# Patient Record
Sex: Female | Born: 1990 | Race: White | Hispanic: No | Marital: Married | Smoking: Never smoker
Health system: Southern US, Community
[De-identification: ages and names within clinical notes are randomized; demographics above are authoritative.]

## PROBLEM LIST (undated history)

## (undated) DIAGNOSIS — H50811 Duane's syndrome, right eye: Secondary | ICD-10-CM

## (undated) DIAGNOSIS — K5090 Crohn's disease, unspecified, without complications: Secondary | ICD-10-CM

## (undated) HISTORY — DX: Duane's syndrome, right eye: H50.811

## (undated) HISTORY — DX: Crohn's disease, unspecified, without complications: K50.90

## (undated) HISTORY — PX: EYE SURGERY: SHX253

## (undated) HISTORY — PX: APPENDECTOMY: SHX54

---

## 2016-03-29 ENCOUNTER — Encounter: Payer: Self-pay | Admitting: Family Medicine

## 2016-03-29 ENCOUNTER — Ambulatory Visit (INDEPENDENT_AMBULATORY_CARE_PROVIDER_SITE_OTHER): Payer: BC Managed Care – PPO | Admitting: Family Medicine

## 2016-03-29 VITALS — BP 120/82 | HR 87 | Temp 98.5°F | Resp 12 | Ht 64.0 in | Wt 145.0 lb

## 2016-03-29 DIAGNOSIS — Z30011 Encounter for initial prescription of contraceptive pills: Secondary | ICD-10-CM

## 2016-03-29 DIAGNOSIS — Z309 Encounter for contraceptive management, unspecified: Secondary | ICD-10-CM | POA: Insufficient documentation

## 2016-03-29 LAB — POCT URINE PREGNANCY: PREG TEST UR: NEGATIVE

## 2016-03-29 MED ORDER — LEVONORGESTREL-ETHINYL ESTRAD 0.1-20 MG-MCG PO TABS: 1.0000 | ORAL_TABLET | Freq: Every day | ORAL | Status: DC

## 2016-03-29 NOTE — Patient Instructions (Addendum)
A few things to remember from today's visit:   Encounter for initial prescription of contraceptive pills - Plan: levonorgestrel-ethinyl estradiol (AVIANE,ALESSE,LESSINA) 0.1-20 MG-MCG tablet, POCT urine pregnancy   Oral contraceptive pills do not protect against sexual transmitted disease. Please take it daily around same time. Some side effects: Headache, nausea, mood swings,vaginal spotting,benign liver tumors, and blood clots (mainly among smokers).  If a new problem present, please set up appointment sooner than planned today. Pap smear needs to be arranged.  Healthy diet and regular physical activity for prevention of chronic illness.

## 2016-03-29 NOTE — Progress Notes (Signed)
Pre visit review using our clinic review tool, if applicable. No additional management support is needed unless otherwise documented below in the visit note. 

## 2016-03-29 NOTE — Progress Notes (Signed)
HPI:   [name] is a 25 y.o. female, who is here today to establish care with me.  Former PCP: Moved to this area recently. Last preventive routine visit: a few years ago. She is otherwise healthy, denies any chronic major medical problem.   Concerns today: She would like to initiate home OCPs, last taken in August 2017, she had to stop because health insurance issues. She had taken OCPs for 3-4 years, initially was started to control dysmenorrhea and irregular menstrual but later for contraception. In general medication was well tolerated, she denies any side effect. No history of tobacco use.  G0. Cycles 5 weeks/7 d. Mild dysmenorrhea but not as bad as it used to be before starting OCP's. She is currently using condoms for contraception. She denies any history of depression or anxiety.   She lives with husband.  She has not had a pap smear.  In general she eats healthy and exercises regularly.     Review of Systems  Constitutional: Negative for fever, activity change, appetite change, fatigue and unexpected weight change.  HENT: Negative for facial swelling and trouble swallowing.   Respiratory: Negative for cough, shortness of breath and wheezing.   Cardiovascular: Negative for chest pain, palpitations and leg swelling.  Gastrointestinal: Negative for nausea, vomiting and abdominal pain.  Genitourinary: Negative for dysuria, vaginal bleeding, vaginal discharge, vaginal pain, menstrual problem (LMP 02/29/16.) and pelvic pain.  Musculoskeletal: Negative for myalgias, arthralgias and gait problem.  Skin: Negative for color change and rash.  Neurological: Negative for syncope, weakness and headaches.  Psychiatric/Behavioral: Negative for behavioral problems and confusion. The patient is not nervous/anxious.       No current outpatient prescriptions on file prior to visit.   No current facility-administered medications on file prior to visit.      History reviewed. No pertinent past medical history. Not on File  History reviewed. No pertinent family history.  Social History   Social History  . Marital Status: Married    Spouse Name: N/A  . Number of Children: N/A  . Years of Education: N/A   Social History Main Topics  . Smoking status: Never Smoker   . Smokeless tobacco: None  . Alcohol Use: None  . Drug Use: None  . Sexual Activity: Not Asked   Other Topics Concern  . None   Social History Narrative  . None    Filed Vitals:   03/29/16 1446  BP: 120/82  Pulse: 87  Temp: 98.5 F (36.9 C)  Resp: 12    Body mass index is 24.88 kg/(m^2).  SpO2 Readings from Last 3 Encounters:  03/29/16 98%     Physical Exam  Nursing note and vitals reviewed. Constitutional: She is oriented to person, place, and time. She appears well-developed and well-nourished. No distress.  HENT:  Head: Atraumatic.  Eyes: Conjunctivae and EOM are normal. Pupils are equal, round, and reactive to light.  Right upper eye lid mild ptosis.  Neck: No thyromegaly present.  Cardiovascular: Normal rate and regular rhythm.   No murmur heard. Pulses:      Dorsalis pedis pulses are 2+ on the right side, and 2+ on the left side.  Respiratory: Effort normal and breath sounds normal. No respiratory distress.  GI: Soft. She exhibits no mass. There is no hepatomegaly. There is no tenderness.  Musculoskeletal: She exhibits no edema.  Lymphadenopathy:    She has no cervical adenopathy.  Neurological: She is alert and oriented to person, place,  and time. She has normal strength. Coordination and gait normal.  Skin: Skin is warm. No erythema.  Psychiatric: She has a normal mood and affect. Her speech is normal.  Well groomed, good eye contact.      ASSESSMENT AND PLAN:     Carla Stephens was seen today for new patient (initial visit).  Diagnoses and all orders for this visit:  Encounter for initial prescription of contraceptive  pills -     levonorgestrel-ethinyl estradiol (AVIANE,ALESSE,LESSINA) 0.1-20 MG-MCG tablet; Take 1 tablet by mouth daily. -     POCT urine pregnancy  Negative urine pregnancy test today.  OCP was sent to the pharmacy. Some side effects of OCPs discussed. Strongly recommended to schedule appointment for a Pap smear. Follow-up in a year, before if needed.        Carla G. Martinique, MD  [facility]. [place] office.

## 2016-11-30 ENCOUNTER — Other Ambulatory Visit: Payer: Self-pay | Admitting: Family Medicine

## 2016-11-30 DIAGNOSIS — Z30011 Encounter for initial prescription of contraceptive pills: Secondary | ICD-10-CM

## 2017-03-02 NOTE — Progress Notes (Addendum)
HPI:   [name] is a 26 y.o. female, who is here today for her routine physical.  She was seen on 03/29/16.  Regular exercise 3 or more time per week: Not consistently. Following a healthy diet: Trying to do better. She lives with her husband.  Chronic medical problems: Otherwise healthy.  Pap smear: Never. Hx of STD's: Denies. G:0 She is sexually active on OCP's, resumed 03/2016. It has also helped with dysmenorrhea.   She has tolerated well,no side effects reported.  She denies depressed mood   Review of Systems  Constitutional: Negative for appetite change, fatigue, fever and unexpected weight change.  HENT: Negative for dental problem, hearing loss, nosebleeds, trouble swallowing and voice change.   Eyes: Negative for photophobia and visual disturbance.  Respiratory: Negative for cough, shortness of breath and wheezing.   Cardiovascular: Negative for chest pain and leg swelling.  Gastrointestinal: Negative for abdominal pain, blood in stool, nausea and vomiting.       No changes in bowel habits.  Endocrine: Negative for cold intolerance, heat intolerance, polydipsia, polyphagia and polyuria.  Genitourinary: Negative for decreased urine volume, dysuria, hematuria, menstrual problem, vaginal bleeding and vaginal discharge.       No breast tenderness or nipple discharge.  Musculoskeletal: Negative for gait problem and myalgias.  Skin: Negative for rash.  Neurological: Negative for syncope, weakness, numbness and headaches.  Hematological: Negative for adenopathy. Does not bruise/bleed easily.  Psychiatric/Behavioral: Negative for confusion and sleep disturbance. The patient is not nervous/anxious.   All other systems reviewed and are negative.    No current outpatient prescriptions on file prior to visit.   No current facility-administered medications on file prior to visit.      Past Medical History:  Diagnosis Date  . 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 Hypertension Father   . Hypertension Maternal Grandfather   . Cancer Neg Hx   . Diabetes Neg Hx   . Hyperlipidemia Neg Hx     Social History   Social History  . Marital status: Married    Spouse name: N/A  . Number of children: N/A  . Years of education: N/A   Social History Main Topics  . Smoking status: Never Smoker  . Smokeless tobacco: Never Used  . Alcohol use None  . Drug use: Unknown  . Sexual activity: Not Asked   Other Topics Concern  . None   Social History Narrative  . None     Vitals:   03/03/17 1125  BP: 120/80  Pulse: 96  Resp: 12   Body mass index is 26.35 kg/m.  O2 sat ar RA 98%  Wt Readings from Last 3 Encounters:  03/03/17 153 lb 8 oz (69.6 kg)  03/29/16 145 lb (65.8 kg)    Physical Exam  Nursing note and vitals reviewed. Constitutional: She is oriented to person, place, and time. She appears well-developed and well-nourished. No distress.  HENT:  Head: Atraumatic.  Right Ear: Hearing, tympanic membrane, external ear and ear canal normal.  Left Ear: Hearing, tympanic membrane, external ear and ear canal normal.  Mouth/Throat: Uvula is midline, oropharynx is clear and moist and mucous membranes are normal.  Eyes: Conjunctivae are normal. Pupils are equal, round, and reactive to light. Right eye exhibits abnormal extraocular motion.  Mild limitation abduction right eye, exophoria.  Neck: No  tracheal deviation present. No thyromegaly present.  Cardiovascular: Normal rate and regular rhythm.   No murmur heard. Pulses:      Dorsalis pedis pulses are 2+ on the right side, and 2+ on the left side.  Respiratory: Effort normal and breath sounds normal. No respiratory distress.  GI: Soft. She exhibits no mass. There is no hepatomegaly. There is no tenderness.  Genitourinary:  Genitourinary Comments: Refused pap smear.    Musculoskeletal: She exhibits no edema.  No major deformity or signs of synovitis appreciated.  Lymphadenopathy:    She has no cervical adenopathy.       Right: No supraclavicular adenopathy present.       Left: No supraclavicular adenopathy present.  Neurological: She is alert and oriented to person, place, and time. She has normal strength. No cranial nerve deficit or sensory deficit. Coordination and gait normal.  Reflex Scores:      Bicep reflexes are 2+ on the right side and 2+ on the left side.      Patellar reflexes are 2+ on the right side and 2+ on the left side. Skin: Skin is warm. No rash noted. No erythema.  Psychiatric: She has a normal mood and affect. Cognition and memory are normal.  Well groomed, good eye contact.     ASSESSMENT AND PLAN:   Carla Stephens was seen today for annual exam.  Diagnoses and all orders for this visit:  Routine general medical examination at a health care facility  We discussed the importance of regular physical activity and healthy diet for prevention of chronic illness. Preventive guidelines reviewed. Vaccination up to date. She refused gyn examination/pap smear today. Strongly recommended setting up appt, she ensures me she will do so mid or later summer/2018. Next CPE in 1-3 years.   Encounter for surveillance of contraceptive pills  Tolerating OCP's well. Side effects reviewed. F/U in a year.  -     levonorgestrel-ethinyl estradiol (AVIANE) 0.1-20 MG-MCG tablet; Take 1 tablet by mouth daily.   Return in 1 year (on 03/03/2018) for pap smear appt needed in the next few months..     Betty G. Martinique, MD  [facility]. [place] office.

## 2017-03-03 ENCOUNTER — Encounter: Payer: Self-pay | Admitting: Family Medicine

## 2017-03-03 ENCOUNTER — Ambulatory Visit (INDEPENDENT_AMBULATORY_CARE_PROVIDER_SITE_OTHER): Payer: BC Managed Care – PPO | Admitting: Family Medicine

## 2017-03-03 VITALS — BP 120/80 | HR 96 | Resp 12 | Ht 64.0 in | Wt 153.5 lb

## 2017-03-03 DIAGNOSIS — Z0001 Encounter for general adult medical examination with abnormal findings: Secondary | ICD-10-CM

## 2017-03-03 DIAGNOSIS — Z3041 Encounter for surveillance of contraceptive pills: Secondary | ICD-10-CM | POA: Diagnosis not present

## 2017-03-03 DIAGNOSIS — H5052 Exophoria: Secondary | ICD-10-CM

## 2017-03-03 DIAGNOSIS — Z0000 Encounter for general adult medical examination without abnormal findings: Secondary | ICD-10-CM

## 2017-03-03 MED ORDER — LEVONORGESTREL-ETHINYL ESTRAD 0.1-20 MG-MCG PO TABS: 1.0000 | ORAL_TABLET | Freq: Every day | ORAL | 2 refills | Status: DC

## 2017-03-03 NOTE — Patient Instructions (Signed)
A few things to remember from today's visit:   Routine general medical examination at a health care facility    At least 150 minutes of moderate exercise per week, daily brisk walking for 15-30 min is a good exercise option. Healthy diet low in saturated (animal) fats and sweets and consisting of fresh fruits and vegetables, lean meats such as fish and white chicken and whole grains.   - Vaccines:  Tdap vaccine every 10 years.  Shingles vaccine recommended at age 26, could be given after 26 years of age but not sure about insurance coverage.  Pneumonia vaccines:  Prevnar 13 at 65 and Pneumovax at 58.  Screening recommendations for low/normal risk women:  Screening for diabetes at age 44-45 and every 3 years.  Cervical cancer prevention:  Must be done. -HPV vaccination between 25-52 years old. -Pap smear starts at 26 years of age and continues periodically until 26 years old in low risk women. Pap smear every 3 years between 45 and 25 years old. Pap smear every 3 years between women 24 and older if pap smear negative and HPV screening negative.   -Breast cancer: Mammogram: There is disagreement between experts about when to start screening in low risk asymptomatic female but recent recommendations are to start screening at 54 and not later than 26 years old , every 1-2 years and after 27 yo q 2 years. Screening is recommended until 26 years old but some women can continue screening depending of healthy issues.   Colon cancer screening: starts at 26 years old until 25 years old.  Cholesterol disorder screening at age 20 and every 3 years.  Also recommended:  1. Dental visit- Brush and floss your teeth twice daily; visit your dentist twice a year. 2. Eye doctor- Get an eye exam at least every 2 years. 3. Helmet use- Always wear a helmet when riding a bicycle, motorcycle, rollerblading or skateboarding. 4. Safe sex- If you may be exposed to sexually transmitted infections, use a  condom. 5. Seat belts- Seat belts can save your live; always wear one. 6. Smoke/Carbon Monoxide detectors- These detectors need to be installed on the appropriate level of your home. Replace batteries at least once a year. 7. Skin cancer- When out in the sun please cover up and use sunscreen 15 SPF or higher. 8. Violence- If anyone is threatening or hurting you, please tell your healthcare provider.  9. Drink alcohol in moderation- Limit alcohol intake to one drink or less per day. Never drink and drive.  Please be sure medication list is accurate. If a new problem present, please set up appointment sooner than planned today.

## 2017-03-13 ENCOUNTER — Encounter: Payer: Self-pay | Admitting: Family Medicine

## 2017-03-13 ENCOUNTER — Ambulatory Visit (INDEPENDENT_AMBULATORY_CARE_PROVIDER_SITE_OTHER): Payer: BC Managed Care – PPO | Admitting: Family Medicine

## 2017-03-13 ENCOUNTER — Other Ambulatory Visit (HOSPITAL_COMMUNITY)
Admission: RE | Admit: 2017-03-13 | Discharge: 2017-03-13 | Disposition: A | Discharge status: Home / Self Care | Payer: BC Managed Care – PPO | Source: Ambulatory Visit | Attending: Family Medicine | Admitting: Family Medicine

## 2017-03-13 VITALS — BP 120/70 | HR 62 | Resp 12 | Ht 64.0 in | Wt 152.5 lb

## 2017-03-13 DIAGNOSIS — Z01419 Encounter for gynecological examination (general) (routine) without abnormal findings: Secondary | ICD-10-CM | POA: Insufficient documentation

## 2017-03-13 DIAGNOSIS — Z124 Encounter for screening for malignant neoplasm of cervix: Secondary | ICD-10-CM

## 2017-03-13 NOTE — Progress Notes (Signed)
      HPI:   Carla Stephens is a 26 y.o. female, who is here today for her pap smear. I saw her recently for her routine CPE, she did not want her pap smear done at that time.  Pap smear :Has not had one before. Hx of STD's: Denies  LMP 5-7 days ago.   Review of Systems  Genitourinary: Negative for dyspareunia, dysuria, menstrual problem, vaginal bleeding and vaginal discharge.       Denies breast tenderness,masses,or nipple discharge.      Current Outpatient Prescriptions on File Prior to Visit  Medication Sig Dispense Refill  . levonorgestrel-ethinyl estradiol (AVIANE) 0.1-20 MG-MCG tablet Take 1 tablet by mouth daily. 84 tablet 2   No current facility-administered medications on file prior to visit.      Past Medical History:  Diagnosis Date  . 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 Hypertension Father   . Hypertension Maternal Grandfather   . Cancer Neg Hx   . Diabetes Neg Hx   . Hyperlipidemia Neg Hx     Social History   Social History  . Marital status: Married    Spouse name: N/A  . Number of children: N/A  . Years of education: N/A   Social History Main Topics  . Smoking status: Never Smoker  . Smokeless tobacco: Never Used  . Alcohol use None  . Drug use: Unknown  . Sexual activity: Not Asked   Other Topics Concern  . None   Social History Narrative  . None     Vitals:   03/13/17 0954  BP: 120/70  Pulse: 62  Resp: 12   Body mass index is 26.18 kg/m.   Wt Readings from Last 3 Encounters:  03/03/17 153 lb 8 oz (69.6 kg)  03/29/16 145 lb (65.8 kg)     Physical Exam  Nursing note reviewed. Constitutional: She is oriented to person, place, and time. She appears well-developed. No distress.  Eyes: Conjunctivae are normal.  GI: Soft. She exhibits no mass. There is no tenderness.  Genitourinary: Uterus normal. There is no  tenderness or lesion on the right labia. There is no tenderness or lesion on the left labia. Cervix exhibits friability. Cervix exhibits no motion tenderness and no discharge. Right adnexum displays no mass and no tenderness. Left adnexum displays no mass and no tenderness. There is bleeding in the vagina. No erythema in the vagina. No vaginal discharge found.  Genitourinary Comments: Refused breast exam. Cervix:Transformation zone visualized, Pap smear colleted.  Musculoskeletal: She exhibits no edema.  Neurological: She is alert and oriented to person, place, and time. Gait normal.  Psychiatric: She has a normal mood and affect.  Well groomed, good eye contact.     ASSESSMENT AND PLAN:   [name] was seen today for gynecologic exam.  Diagnoses and all orders for this visit:  Cervical cancer screening -     PAP [[place]]  Screening for malignant neoplasm of cervix -     PAP [[facility] [facility]]   Pap smear collected. Current recommendations for gyn preventive care. If negative next pap smear can be done in 3 years.    Return in 1 year (on 03/13/2018).    Jayce Boyko G. Martinique, MD  [facility]. [place] office.

## 2017-03-14 ENCOUNTER — Encounter: Payer: Self-pay | Admitting: Family Medicine

## 2017-03-14 LAB — CYTOLOGY - PAP: Diagnosis: NEGATIVE

## 2017-06-08 ENCOUNTER — Encounter: Payer: Self-pay | Admitting: Family Medicine

## 2017-07-10 ENCOUNTER — Encounter: Payer: Self-pay | Admitting: Family Medicine

## 2017-07-10 ENCOUNTER — Ambulatory Visit (INDEPENDENT_AMBULATORY_CARE_PROVIDER_SITE_OTHER): Payer: BC Managed Care – PPO | Admitting: Family Medicine

## 2017-07-10 VITALS — BP 116/66 | HR 82 | Temp 98.5°F | Resp 12 | Ht 64.0 in | Wt 155.2 lb

## 2017-07-10 DIAGNOSIS — R197 Diarrhea, unspecified: Secondary | ICD-10-CM

## 2017-07-10 DIAGNOSIS — K625 Hemorrhage of anus and rectum: Secondary | ICD-10-CM | POA: Diagnosis not present

## 2017-07-10 NOTE — Patient Instructions (Signed)
A few things to remember from today's visit:   Rectal bleeding - Plan: CBC, C-reactive protein, Sedimentation rate, Ambulatory referral to Gastroenterology  Diarrhea, unspecified type - Plan: Ova and Parasite Examination   Please be sure medication list is accurate. If a new problem present, please set up appointment sooner than planned today.

## 2017-07-10 NOTE — Progress Notes (Signed)
ACUTE VISIT   HPI:  Chief Complaint  Patient presents with  . Blood In Stools    [name] is a 26 y.o. female, who is here today complaining of  2 weeks of red bright rectal bleeding associated with defecation. States that she sees blood on tissue and in toilet 99% of the time for the past 2 weeks.  She had similar problem in 03/2017 but resolved after a week.  She denies associated fever, chills, myalgias, dyspnea, abdominal pain, nausea, vomiting, or changes in bowel habits. Later during visit and after examination she remembers she had a couple of loose stools since problem started.  She denies dyschezia or history of constipation/straining. Denies recent travel. No recent changes in her diet. No abx treatments in the past few months. She has not identified exacerbating or factors.  She denies FHx of IBD or colon cancer.   No other abnormal bleeding or heavier menstrual flow.  Review of Systems  Constitutional: Negative for activity change, appetite change, fatigue, fever and unexpected weight change.  HENT: Negative for mouth sores, nosebleeds, sore throat and trouble swallowing.   Respiratory: Negative for cough, shortness of breath and wheezing.   Gastrointestinal: Positive for anal bleeding and diarrhea. Negative for abdominal distention, abdominal pain, constipation, nausea and vomiting.  Endocrine: Negative for cold intolerance and heat intolerance.  Genitourinary: Negative for dysuria, frequency, hematuria and menstrual problem.  Musculoskeletal: Negative for arthralgias, gait problem and neck pain.  Skin: Negative for pallor and rash.  Allergic/Immunologic: Negative for food allergies.  Neurological: Negative for syncope, weakness and headaches.  Hematological: Negative for adenopathy. Does not bruise/bleed easily.      Current Outpatient Prescriptions on File Prior to Visit  Medication Sig Dispense Refill  . levonorgestrel-ethinyl  estradiol (AVIANE) 0.1-20 MG-MCG tablet Take 1 tablet by mouth daily. 84 tablet 2   No current facility-administered medications on file prior to visit.      Past Medical History:  Diagnosis Date  . 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 Marital status: Married    Spouse name: N/A  . Number of children: N/A  . Years of education: N/A   Social History Main Topics  . Smoking status: Never Smoker  . Smokeless tobacco: Never Used  . Alcohol use No  . Drug use: No  . Sexual activity: Yes   Other Topics Concern  . None   Social History Narrative  . None    Vitals:   07/10/17 1625  BP: 116/66  Pulse: 82  Resp: 12  Temp: 98.5 F (36.9 C)  SpO2: 98%   Body mass index is 26.65 kg/m.   Physical Exam  Nursing note and vitals reviewed. Constitutional: She is oriented to person, place, and time. She appears well-developed and well-nourished. She does not appear ill. No distress.  HENT:  Head: Atraumatic.  Mouth/Throat: Oropharynx is clear and moist and mucous membranes are normal.  Eyes: Conjunctivae are normal. No scleral icterus.  Neck: No tracheal deviation present. No thyromegaly present.  Cardiovascular: Normal rate and regular rhythm.   No murmur heard. Respiratory: Effort normal and breath sounds normal. No respiratory distress.  GI: Soft. Bowel sounds are normal. She exhibits no distension and no mass. There is no hepatomegaly. There is no tenderness. There is no CVA tenderness.  Genitourinary: Rectal exam shows guaiac positive stool. Rectal exam shows  no fissure, no mass, no tenderness and anal tone normal.  Genitourinary Comments: External hemorrhoids palpable in anal canal, not noted on inspection. She had some discomfort with rectal exam, denies pain. Small amount of loose stool in glove, light brownish,no blood.  Musculoskeletal: She exhibits no edema.  Lymphadenopathy:    She  has no cervical adenopathy.  Neurological: She is alert and oriented to person, place, and time. She has normal strength. Gait normal.  Skin: Skin is warm. No rash noted. No erythema.  Psychiatric: She has a normal mood and affect.  Well groomed, good eye contact.      ASSESSMENT AND PLAN:   [name] was seen today for blood in stools.  Diagnoses and all orders for this visit:  Rectal bleeding  We discussed possible etiologies, including internal and external hemorrhoids,inflammatory,and infectious. Because no associated abdominal pain, I am holding on stool Cx.  Because it does not seem to be related to constipation and it seems to be recurrent, I think it is appropriate to arrange for GI evaluation.  For the recommendations would be given according to lab results. Instructed about warning signs.  -     CBC -     C-reactive protein -     Sedimentation rate -     Ambulatory referral to Gastroenterology  Diarrhea, unspecified type  Reporting a couple episodes. Adequate hydration. Further recommendations would be given according to stool examination.  -     Ova and Parasite Examination   -[name] was advised to seek immediate medical attention if sudden worsening symptoms.     Carla Besse G. Martinique, MD  [facility]. [place] office.

## 2017-07-11 ENCOUNTER — Encounter: Payer: Self-pay | Admitting: Gastroenterology

## 2017-07-11 LAB — CBC
HCT: 43.8 % (ref 36.0–46.0)
Hemoglobin: 14.5 g/dL (ref 12.0–15.0)
MCHC: 33.1 g/dL (ref 30.0–36.0)
MCV: 84.2 fl (ref 78.0–100.0)
Platelets: 342 10*3/uL (ref 150.0–400.0)
RBC: 5.21 Mil/uL — ABNORMAL HIGH (ref 3.87–5.11)
RDW: 14.1 % (ref 11.5–15.5)
WBC: 9.1 10*3/uL (ref 4.0–10.5)

## 2017-07-11 LAB — C-REACTIVE PROTEIN: CRP: 1.6 mg/dL (ref 0.5–20.0)

## 2017-07-11 LAB — SEDIMENTATION RATE: SED RATE: 51 mm/h — AB (ref 0–20)

## 2017-07-14 ENCOUNTER — Encounter: Payer: Self-pay | Admitting: Family Medicine

## 2017-07-20 ENCOUNTER — Encounter: Payer: Self-pay | Admitting: Family Medicine

## 2017-07-20 LAB — OVA AND PARASITE EXAMINATION
CONCENTRATE RESULT:: NONE SEEN
SPECIMEN QUALITY:: ADEQUATE
TRICHROME RESULT:: NONE SEEN
VKL: 81215422

## 2017-07-20 LAB — TIQ-NTM

## 2017-08-22 ENCOUNTER — Encounter: Payer: Self-pay | Admitting: Gastroenterology

## 2017-08-28 ENCOUNTER — Ambulatory Visit: Payer: BC Managed Care – PPO | Admitting: Gastroenterology

## 2017-09-06 ENCOUNTER — Ambulatory Visit: Payer: BC Managed Care – PPO | Admitting: Gastroenterology

## 2017-09-13 ENCOUNTER — Ambulatory Visit: Payer: BC Managed Care – PPO | Admitting: Gastroenterology

## 2017-09-22 ENCOUNTER — Ambulatory Visit: Payer: BC Managed Care – PPO | Admitting: Physician Assistant

## 2017-09-22 ENCOUNTER — Encounter: Payer: Self-pay | Admitting: Physician Assistant

## 2017-09-22 VITALS — BP 122/80 | HR 88 | Ht 64.0 in | Wt 155.2 lb

## 2017-09-22 DIAGNOSIS — K602 Anal fissure, unspecified: Secondary | ICD-10-CM | POA: Diagnosis not present

## 2017-09-22 DIAGNOSIS — K921 Melena: Secondary | ICD-10-CM

## 2017-09-22 MED ORDER — AMBULATORY NON FORMULARY MEDICATION: 0.1250 mg | Freq: Two times a day (BID) | 1 refills | Status: DC

## 2017-09-22 NOTE — Patient Instructions (Signed)
We have sent a prescription for nitroglycerin 0.125% gel to [facility]. You should apply a pea size amount to your rectum twice times daily x 6-8 weeks.  [facility]'s information is below: Address: [address], [place], [place] [address]  [phone]   How to Take a Sitz Bath A sitz bath is a warm water bath that is taken while you are sitting down. The water should only come up to your hips and should cover your buttocks. Your health care provider may recommend a sitz bath to help you:  Clean the lower part of your body, including your genital area.  With itching.  With pain.  With sore muscles or muscles that tighten or spasm.  How to take a sitz bath Take 3-4 sitz baths per day or as told by your health care provider. 1. Partially fill a bathtub with warm water. You will only need the water to be deep enough to cover your hips and buttocks when you are sitting in it. 2. If your health care provider told you to put medicine in the water, follow the directions exactly. 3. Sit in the water and open the tub drain a little. 4. Turn on the warm water again to keep the tub at the correct level. Keep the water running constantly. 5. Soak in the water for 15-20 minutes or as told by your health care provider. 6. After the sitz bath, pat the affected area dry first. Do not rub it. 7. Be careful when you stand up after the sitz bath because you may feel dizzy.  Contact a health care provider if:  Your symptoms get worse. Do not continue with sitz baths if your symptoms get worse.  You have new symptoms. Do not continue with sitz baths until you talk with your health care provider. This information is not intended to replace advice given to you by your health care provider. Make sure you discuss any questions you have with your health care provider. Document Released: 05/28/2004 Document Revised: 02/03/2016 Document Reviewed: 09/03/2014 Elsevier Interactive  Patient Education  [employer].

## 2017-09-22 NOTE — Progress Notes (Signed)
 Chief Complaint: Rectal bleeding  HPI:    Carla Stephens is a 26-year-old Caucasian female with a past medical history as listed below, who was referred to me by Jordan, Betty G, MD for a complaint of rectal bleeding.      Review of chart patient saw her PCP 07/10/17 and did have guaiac positive stool with external hemorrhoids palpable in the anal canal, not noted on inspection she had some discomfort with rectal exam and a small amount of loose stool on the glove which was light brownish with no blood.  Patient had a CRP, CBC and ESR as well as ova and parasite stool.  ESR was elevated at 51, CRP was normal, CBC normal.  O&P was negative.    Today, the patient describes that the first episodes of rectal bleeding began in July and occurred for about 2 weeks straight, this then went away and came back in October and patient tells me that now it is about 80-90% of the time when she has a bowel movement.  The patient describes this as bright red blood which she sees on the toilet paper and in her stool as well as in the toilet bowl at times.  This can vary in amount.  Sometimes she sees just a drop and other times it is more.  Patient describes that there is very little rectal tenderness, though "when I have a larger than normal stools are harder than normal stool it does hurt".  Patient has not been trying to do anything for this other than starting a fiber tablet which has seemed to help with her days of occasional loose stools which occur "here and there", the patient blames this on what she eats occasionally.    Patient denies fever, chills, weight loss, anorexia, nausea, vomiting, change in bowel habits or family history of IBD.  Past Medical History:  Diagnosis Date  . 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 APPENDECTOMY    . EYE SURGERY Bilateral     Current Outpatient Medications  Medication Sig Dispense Refill  .  levonorgestrel-ethinyl estradiol (AVIANE) 0.1-20 MG-MCG tablet Take 1 tablet by mouth daily. 84 tablet 2   No current facility-administered medications for this visit.     Allergies as of 09/22/2017  . (No Known Allergies)    Family History  Problem Relation Age of Onset  . Hypertension Father   . Hypertension Maternal Grandfather   . Cancer Neg Hx   . Diabetes Neg Hx   . Hyperlipidemia Neg Hx     Social History   Socioeconomic History  . Marital status: Married    Spouse name: Not on file  . Number of children: Not on file  . Years of education: Not on file  . Highest education level: Not on file  Social Needs  . Financial resource strain: Not on file  . Food insecurity - worry: Not on file  . Food insecurity - inability: Not on file  . Transportation needs - medical: Not on file  . Transportation needs - non-medical: Not on file  Occupational History  . Not on file  Tobacco Use  . Smoking status: Never Smoker  . Smokeless tobacco: Never Used  Substance and Sexual Activity  . Alcohol use: No    Alcohol/week: 0.0 oz  . Drug use: No  . Sexual activity: Yes  Other Topics Concern  . Not on   file  Social History Narrative  . Not on file    Review of Systems:    Constitutional: No weight loss, fever or chills Skin: No rash Cardiovascular: No chest pain Respiratory: No SOB  Gastrointestinal: See HPI and otherwise negative Genitourinary: No dysuria  Neurological: No headache, dizziness or syncope Musculoskeletal: No new muscle or joint pain Hematologic: No brusising Psychiatric: No history of depression or anxiety   Physical Exam:  Vital signs: BP 122/80   Pulse 88   Ht 5' 4" (1.626 m)   Wt 155 lb 3.2 oz (70.4 kg)   BMI 26.64 kg/m    Constitutional:   Pleasant Caucasian female appears to be in NAD, Well developed, Well nourished, alert and cooperative Head:  Normocephalic and atraumatic. Eyes:   PEERL, EOMI. No icterus. Conjunctiva pink. Ears:  Normal  auditory acuity. Neck:  Supple Throat: Oral cavity and pharynx without inflammation, swelling or lesion.  Respiratory: Respirations even and unlabored. Lungs clear to auscultation bilaterally.   No wheezes, crackles, or rhonchi.  Cardiovascular: Normal S1, S2. No MRG. Regular rate and rhythm. No peripheral edema, cyanosis or pallor.  Gastrointestinal:  Soft, nondistended, nontender. No rebound or guarding. Normal bowel sounds. No appreciable masses or hepatomegaly. Rectal: External exam: Visible posterior anal fissure approximately 2 cm in length and fairly deep with no signs of active bleeding, very tender to palpation; internal exam deferred due to findings of external exam Msk:  Symmetrical without gross deformities. Without edema, no deformity or joint abnormality.  Neurologic:  Alert and  oriented x4;  grossly normal neurologically.  Skin:   Dry and intact without significant lesions or rashes. Psychiatric: Demonstrates good judgement and reason without abnormal affect or behaviors.  MOST RECENT LABS AND IMAGING: CBC    Component Value Date/Time   WBC 9.1 07/10/2017 1714   RBC 5.21 (H) 07/10/2017 1714   HGB 14.5 07/10/2017 1714   HCT 43.8 07/10/2017 1714   PLT 342.0 07/10/2017 1714   MCV 84.2 07/10/2017 1714   MCHC 33.1 07/10/2017 1714   RDW 14.1 07/10/2017 1714   Assessment: 1.  Anal fissure: Seen at time of exam today with bleeding off and on over the past 3 months, very tender on palpation today, discussed that this is very likely the cause of patient's rectal bleeding 2. Hematochezia: With above.  Plan: 1.  Prescribed Nitroglycerin ointment to be applied twice daily times 6-8 weeks 2.  Recommend the patient do sitz baths for 15-20 minutes at a time 2-3 times a day 3.  Encouraged the patient to continue her fiber supplement as this is seeming to decrease the amount of days that she has loose stools. 4.  Discussed that the patient can buy over-the-counter recta- care cream  with lidocaine if she has any pain at site of fissure 5.  Patient to follow in clinic with me in 6-8 weeks.  She was assigned to Dr. Stark today.   , PA-C [facility] 09/22/2017, 2:11 PM  Cc: Jordan, Betty G, MD 

## 2017-09-22 NOTE — Progress Notes (Signed)
Reviewed and agree with initial management plan.  Aspynn Clover T. Tiki Tucciarone, MD FACG 

## 2018-01-11 ENCOUNTER — Other Ambulatory Visit: Payer: Self-pay | Admitting: Family Medicine

## 2018-01-11 DIAGNOSIS — Z3041 Encounter for surveillance of contraceptive pills: Secondary | ICD-10-CM

## 2018-05-06 ENCOUNTER — Other Ambulatory Visit: Payer: Self-pay | Admitting: Family Medicine

## 2018-05-06 DIAGNOSIS — Z3041 Encounter for surveillance of contraceptive pills: Secondary | ICD-10-CM

## 2018-05-18 ENCOUNTER — Other Ambulatory Visit: Payer: Self-pay | Admitting: Family Medicine

## 2018-05-18 DIAGNOSIS — Z3041 Encounter for surveillance of contraceptive pills: Secondary | ICD-10-CM

## 2018-08-03 ENCOUNTER — Encounter: Payer: Self-pay | Admitting: Adult Health

## 2018-08-03 ENCOUNTER — Ambulatory Visit: Payer: BC Managed Care – PPO | Admitting: Adult Health

## 2018-08-03 VITALS — BP 120/84 | Temp 98.7°F | Wt 151.0 lb

## 2018-08-03 DIAGNOSIS — J40 Bronchitis, not specified as acute or chronic: Secondary | ICD-10-CM | POA: Diagnosis not present

## 2018-08-03 MED ORDER — PREDNISONE 10 MG PO TABS: ORAL_TABLET | ORAL | 0 refills | Status: DC

## 2018-08-03 MED ORDER — HYDROCODONE-HOMATROPINE 5-1.5 MG/5ML PO SYRP: 5.0000 mL | ORAL_SOLUTION | Freq: Three times a day (TID) | ORAL | 0 refills | Status: DC | PRN

## 2018-08-03 NOTE — Progress Notes (Signed)
Subjective:    Patient ID: Carla Stephens, female    DOB: 1991/02/14, 27 y.o.   MRN: [id number]  Cough  This is a new problem. The current episode started 1 to 4 weeks ago (2 weeks). The problem occurs constantly. The cough is non-productive. Pertinent negatives include no ear congestion, fever, headaches, nasal congestion, postnasal drip, rhinorrhea, sore throat, shortness of breath or wheezing. The symptoms are aggravated by lying down. She has tried OTC cough suppressant for the symptoms. The treatment provided no relief. Her past medical history is significant for bronchitis. There is no history of asthma, emphysema or pneumonia.    Review of Systems  Constitutional: Negative.  Negative for fever.  HENT: Negative for postnasal drip, rhinorrhea and sore throat.   Respiratory: Positive for cough and chest tightness. Negative for shortness of breath and wheezing.   Cardiovascular: Negative.   Gastrointestinal: Negative.   Neurological: Negative for headaches.  Psychiatric/Behavioral: Negative.    Past Medical History:  Diagnosis Date  . Carla syndrome of right eye    s/p corrective eye surgery    Social History   Socioeconomic History  . Marital status: Married    Spouse name: Not on file  . Number of children: Not on file  . Years of education: Not on file  . Highest education level: Not on file  Occupational History  . Not on file  Social Needs  . Financial resource strain: Not on file  . Food insecurity:    Worry: Not on file    Inability: Not on file  . Transportation needs:    Medical: Not on file    Non-medical: Not on file  Tobacco Use  . Smoking status: Never Smoker  . Smokeless tobacco: Never Used  Substance and Sexual Activity  . Alcohol use: No    Alcohol/week: 0.0 standard drinks  . Drug use: No  . Sexual activity: Yes  Lifestyle  . Physical activity:    Days per week: Not on file    Minutes per session: Not on file  . Stress: Not on file    Relationships  . Social connections:    Talks on phone: Not on file    Gets together: Not on file    Attends religious service: Not on file    Active member of club or organization: Not on file    Attends meetings of clubs or organizations: Not on file    Relationship status: Not on file  . Intimate partner violence:    Fear of current or ex partner: Not on file    Emotionally abused: Not on file    Physically abused: Not on file    Forced sexual activity: Not on file  Other Topics Concern  . Not on file  Social History Narrative  . Not on file    Past Surgical History:  Procedure Laterality Date  . APPENDECTOMY    . EYE SURGERY Bilateral     Family History  Problem Relation Age of Onset  . Hypertension Mother   . Hypertension Maternal Grandmother   . Cancer Neg Hx   . Diabetes Neg Hx   . Hyperlipidemia Neg Hx   . Colon cancer Neg Hx   . Esophageal cancer Neg Hx   . Pancreatic cancer Neg Hx   . Liver disease Neg Hx     No Known Allergies  Current Outpatient Medications on File Prior to Visit  Medication Sig Dispense Refill  . AMBULATORY NON FORMULARY MEDICATION  Place 0.125 mg rectally 2 (two) times daily. Medication Name: Nitroglycerin Ointment 30 g 1  . AVIANE 0.1-20 MG-MCG tablet TAKE 1 TABLET BY MOUTH ONCE DAILY 84 tablet 0   No current facility-administered medications on file prior to visit.     BP 120/84   Temp 98.7 F (37.1 C)   Wt 151 lb (68.5 kg)   BMI 25.92 kg/m       Objective:   Physical Exam  Constitutional: She is oriented to person, place, and time. She appears well-developed and well-nourished. No distress.  HENT:  Nose: Nose normal.  Mouth/Throat: Oropharynx is clear and moist. No oropharyngeal exudate.  Eyes: Pupils are equal, round, and reactive to light. Conjunctivae and EOM are normal. Right eye exhibits no discharge. Left eye exhibits no discharge. No scleral icterus.  Neck: Normal range of motion. Neck supple.  Cardiovascular:  Normal rate, regular rhythm, normal heart sounds and intact distal pulses.  Pulmonary/Chest: Effort normal. No stridor. No respiratory distress. She has wheezes (trace expiratory throughout ). She has no rales. She exhibits no tenderness.  Neurological: She is alert and oriented to person, place, and time.  Skin: Skin is warm and dry. Capillary refill takes less than 2 seconds. She is not diaphoretic.  Psychiatric: She has a normal mood and affect. Her behavior is normal. Thought content normal.  Nursing note and vitals reviewed.     Assessment & Plan:  1. Bronchitis - predniSONE (DELTASONE) 10 MG tablet; 40 mg x 3 days, 20 mg x 3 days, 10 mg x 3 days  Dispense: 21 tablet; Refill: 0 - HYDROcodone-homatropine (HYCODAN) 5-1.5 MG/5ML syrup; Take 5 mLs by mouth every 8 (eight) hours as needed for cough.  Dispense: 120 mL; Refill: 0 - Follow up if no improvement in the next 2-3 days   Dorothyann Peng, NP

## 2018-09-05 ENCOUNTER — Encounter: Payer: Self-pay | Admitting: Family Medicine

## 2018-09-05 ENCOUNTER — Ambulatory Visit: Payer: BC Managed Care – PPO | Admitting: Family Medicine

## 2018-09-05 VITALS — BP 116/72 | HR 80 | Temp 98.5°F | Resp 12 | Ht 64.0 in | Wt 152.0 lb

## 2018-09-05 DIAGNOSIS — R05 Cough: Secondary | ICD-10-CM

## 2018-09-05 DIAGNOSIS — R059 Cough, unspecified: Secondary | ICD-10-CM

## 2018-09-05 NOTE — Progress Notes (Signed)
ACUTE VISIT  HPI:  Chief Complaint  Patient presents with  . Cough    x 2 weeks, not completely gone    CarlaCarla Stephens is a 27 y.o.female here today complaining of 2 weeks of non-productive cough. She was seen on 08/03/2017 because of persistent cough. She started with a "cold" at the end of 06/2018, most symptoms resolved except for the cough.  She has not noted nausea, vomiting, abdominal pain, or heartburn. She denies wheezing, dyspnea, or chest pain. Negative for fever, chills, or body aches. Cough has gradually improved, it seems to be worse when lying down.  Cough  This is a new problem. The current episode started more than 1 month ago. The problem has been gradually improving. The cough is non-productive. Associated symptoms include nasal congestion, postnasal drip and rhinorrhea. Pertinent negatives include no chest pain, chills, ear congestion, ear pain, fever, headaches, heartburn, hemoptysis, myalgias, rash, sore throat, shortness of breath or wheezing. The symptoms are aggravated by lying down. She has tried prescription cough suppressant for the symptoms. There is no history of asthma, COPD or environmental allergies.   No Hx of recent travel. No sick contact. No known insect bite.  Hx of allergies: Negative. No history of tobacco use.  OTC medications for this problem: She was prescribed Hycodan, she has not taken it for a few days.   Review of Systems  Constitutional: Negative for activity change, appetite change, chills, fatigue and fever.  HENT: Positive for postnasal drip and rhinorrhea. Negative for ear pain, facial swelling, mouth sores, sinus pressure, sore throat, trouble swallowing and voice change.   Respiratory: Positive for cough. Negative for hemoptysis, shortness of breath and wheezing.   Cardiovascular: Negative for chest pain.  Gastrointestinal: Negative for abdominal pain, diarrhea, heartburn, nausea and vomiting.    Musculoskeletal: Negative for myalgias.  Skin: Negative for rash.  Allergic/Immunologic: Negative for environmental allergies.  Neurological: Negative for weakness and headaches.      Current Outpatient Medications on File Prior to Visit  Medication Sig Dispense Refill  . AVIANE 0.1-20 MG-MCG tablet TAKE 1 TABLET BY MOUTH ONCE DAILY 84 tablet 0   No current facility-administered medications on file prior to visit.      Past Medical History:  Diagnosis Date  . 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 Marital status: Married    Spouse name: Not on file  . Number of children: Not on file  . Years of education: Not on file  . Highest education level: Not on file  Occupational History  . Not on file  Social Needs  . Financial resource strain: Not on file  . Food insecurity:    Worry: Not on file    Inability: Not on file  . Transportation needs:    Medical: Not on file    Non-medical: Not on file  Tobacco Use  . Smoking status: Never Smoker  . Smokeless tobacco: Never Used  Substance and Sexual Activity  . Alcohol use: No    Alcohol/week: 0.0 standard drinks  . Drug use: No  . Sexual activity: Yes  Lifestyle  . Physical activity:    Days per week: Not on file    Minutes per session: Not on file  . Stress: Not on file  Relationships  . Social connections:    Talks on phone: Not on file  Gets together: Not on file    Attends religious service: Not on file    Active member of club or organization: Not on file    Attends meetings of clubs or organizations: Not on file    Relationship status: Not on file  Other Topics Concern  . Not on file  Social History Narrative  . Not on file    Vitals:   09/05/18 1544  BP: 116/72  Pulse: 80  Resp: 12  Temp: 98.5 F (36.9 C)  SpO2: 97%   Body mass index is 26.09 kg/m.   Physical Exam  Nursing note and vitals  reviewed. Constitutional: She is oriented to person, place, and time. She appears well-developed and well-nourished. She does not appear ill. No distress.  HENT:  Head: Normocephalic and atraumatic.  Nose: Right sinus exhibits no maxillary sinus tenderness and no frontal sinus tenderness. Left sinus exhibits no maxillary sinus tenderness and no frontal sinus tenderness.  Mouth/Throat: Oropharynx is clear and moist and mucous membranes are normal.  Mild postnasal drainage. Hypertrophic turbinates.  Eyes: Conjunctivae are normal.  Cardiovascular: Normal rate and regular rhythm.  No murmur heard. Respiratory: Effort normal and breath sounds normal. No respiratory distress.  Lymphadenopathy:    She has no cervical adenopathy.  Neurological: She is alert and oriented to person, place, and time. She has normal strength.  Skin: Skin is warm. No rash noted. No erythema.  Psychiatric: She has a normal mood and affect.  Well groomed, good eye contact.      ASSESSMENT AND PLAN:  Carla Stephens was seen today for cough.  Diagnoses and all orders for this visit:  Cough  We discussed possible etiologies, including allergies, residual from recent URI, and GI. Reporting that is getting better, no associated symptoms. I do not think imaging is needed today. Instructed about warning signs. She can continue Hycodan at bedtime, some side effect discussed. Follow-up as needed.        Chalmer  G. Martinique, MD  [facility] [place] office.

## 2018-09-05 NOTE — Patient Instructions (Signed)
A few things to remember from today's visit:   Cough  I do not think antibiotic is needed given the fact you are feeling slowly better. Monitor for fever, shortness of breath, or wheezing. Continue cough medication that was prescribed. Nasal spray, Flonase, daily may help with cough.

## 2019-07-18 ENCOUNTER — Encounter: Payer: Self-pay | Admitting: Internal Medicine

## 2019-07-18 ENCOUNTER — Telehealth (INDEPENDENT_AMBULATORY_CARE_PROVIDER_SITE_OTHER): Payer: BC Managed Care – PPO | Admitting: Internal Medicine

## 2019-07-18 ENCOUNTER — Other Ambulatory Visit: Payer: Self-pay

## 2019-07-18 DIAGNOSIS — R05 Cough: Secondary | ICD-10-CM | POA: Diagnosis not present

## 2019-07-18 DIAGNOSIS — J069 Acute upper respiratory infection, unspecified: Secondary | ICD-10-CM

## 2019-07-18 DIAGNOSIS — R059 Cough, unspecified: Secondary | ICD-10-CM

## 2019-07-18 NOTE — Progress Notes (Signed)
Virtual Visit via Video Note  I connected with@ on 07/18/19 at 10:30 AM EDT by a video enabled telemedicine application and verified that I am speaking with the correct person using two identifiers. Location patient: home Location provider home office Persons participating in the virtual visit: patient, provider  [place] national recommendations  regarding COVID 19 pandemic   video visit is advised over in office visit for this patient.  Patient aware  of the limitations of evaluation and management by telemedicine and  availability of in person appointments. and agreed to proceed.   HPI: Carla Stephens presents for video visit  PCP NA  She had the onset last week of runny nose URI symptoms a dry cough.  She got tested about 5 days ago for Covid which was negative at the [facility].  She is continue to improve never had fever vomiting diarrhea or unusual rash.  However she does have a continued dry cough which she has had before post respiratory infections that she calls "bronchitis".  She has had prednisone and cough medicines in the past.  Currently she has more cough at night but otherwise feels well took some leftover hydrocodone cough syrup which helped at night but makes her drowsy in the day.  She is taking over-the-counter DayQuil with some help. 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 She will need a note to go back to work if she has a continued cough to be cleared.  ROS: See pertinent positives and negatives per HPI.  No wheezing or shortness of breath. No history of underlying lung disease asthma or allergy.  Review of records shows a respiratory illness last fall which was prolonged cough and ended up being treated with prednisone and cough medicine. Past Medical History:  Diagnosis Date  . 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 APPENDECTOMY    .  EYE SURGERY Bilateral     Family History  Problem Relation Age of Onset  . Hypertension Mother   . Hypertension Maternal Grandmother   . Cancer Neg Hx   . Diabetes Neg Hx   . Hyperlipidemia Neg Hx   . Colon cancer Neg Hx   . Esophageal cancer Neg Hx   . Pancreatic cancer Neg Hx   . Liver disease Neg Hx     Social History   Tobacco Use  . Smoking status: Never Smoker  . Smokeless tobacco: Never Used  Substance Use Topics  . Alcohol use: No    Alcohol/week: 0.0 standard drinks  . Drug use: No      Current Outpatient Medications:  .  AVIANE 0.1-20 MG-MCG tablet, TAKE 1 TABLET BY MOUTH ONCE DAILY, Disp: 84 tablet, Rfl: 0  EXAM: BP Readings from Last 3 Encounters:  09/05/18 116/72  08/03/18 120/84  09/22/17 122/80    VITALS per patient if applicable:  GENERAL: alert, oriented, appears well and in no acute distress  HEENT: atraumatic, conjunttiva clear, no obvious abnormalities on inspection of external nose and ears  NECK: normal movements of the head and neck  LUNGS: on inspection no signs of respiratory distress, breathing rate appears normal, no obvious gross SOB, gasping or wheezing  CV: no obvious cyanosis   PSYCH/NEURO: pleasant and cooperative, no obvious depression or anxiety, speech and thought processing grossly intact Lab Results  Component Value Date   WBC 9.1 07/10/2017  HGB 14.5 07/10/2017   HCT 43.8 07/10/2017   PLT 342.0 07/10/2017    ASSESSMENT AND PLAN:  Discussed the following assessment and plan:    ICD-10-CM   1. Cough  R05   2. Viral upper respiratory tract infection with cough  J06.9    Resolving improved.   Resolving viral respiratory infection over 10 days no complications obvious at this time would not add prednisone or any antibiotic's is expect continued improvement quieting of cough discussed options.  At this time she will continue to use what meds she has in at other modalities that we discussed.  She should contact us  with alarm symptoms she may continue to cough for another week or so and then improved.  If not contact our medical team. I do not expect her to be contagious therefore will send her a note by my chart that she can return to work on Monday, November 2. Counseled.   Expectant management and discussion of plan and treatment with opportunity to ask questions and all were answered. The patient agreed with the plan and demonstrated an understanding of the instructions.   Advised to call back or seek an in-person evaluation if worsening  or having  further concerns .     Shanon Ace, MD

## 2020-05-04 ENCOUNTER — Encounter: Payer: Self-pay | Admitting: Internal Medicine

## 2020-05-04 ENCOUNTER — Ambulatory Visit: Payer: BC Managed Care – PPO | Admitting: Internal Medicine

## 2020-05-04 ENCOUNTER — Other Ambulatory Visit: Payer: Self-pay

## 2020-05-04 VITALS — BP 122/84 | HR 112 | Temp 99.3°F | Ht 64.0 in | Wt 148.8 lb

## 2020-05-04 DIAGNOSIS — R109 Unspecified abdominal pain: Secondary | ICD-10-CM

## 2020-05-04 DIAGNOSIS — R198 Other specified symptoms and signs involving the digestive system and abdomen: Secondary | ICD-10-CM

## 2020-05-04 DIAGNOSIS — R8281 Pyuria: Secondary | ICD-10-CM

## 2020-05-04 LAB — POC URINALSYSI DIPSTICK (AUTOMATED)
Bilirubin, UA: POSITIVE
Blood, UA: POSITIVE
Glucose, UA: NEGATIVE
Ketones, UA: POSITIVE
Nitrite, UA: NEGATIVE
Protein, UA: POSITIVE — AB
Spec Grav, UA: >=1.03 — AB (ref 1.010–1.025)
Urobilinogen, UA: 1 E.U./dL
pH, UA: 6 (ref 5.0–8.0)

## 2020-05-04 LAB — POCT URINE PREGNANCY: Preg Test, Ur: NEGATIVE

## 2020-05-04 NOTE — Patient Instructions (Signed)
Will notify you  of labs when available.   Keep appt with  GI .   Would limit  The  advil for now .   May do abdominal ultrasound in interim  Can try adding  pepcid famotidine  Twice a day for 2 weeks to see if helps .   Plan fu  Dr Martinique if getting worse in interim .

## 2020-05-04 NOTE — Progress Notes (Signed)
Chief Complaint  Patient presents with  . Abdominal Pain    going on since mid June, after eating has bloating, constipation, gas cramps, worse at night, seems to happen during her meals, sharp pain at times    HPI: Carla Stephens 29 y.o. come in for SDA   PCP appt NA  About  Mid Junebegan having a   Clenching feelin in upper abdomen usua assoc witth eating  Sometimes rite aware in meal    When wanting to eat and then worse   . Began at beginning of meal and then throughout  See above Stopped  mvi  And  Helped short term.    pain when down   Taking ibuprofen at night recent to help  Last Friday  After eating a sub    More intense and vomiting   X  While eating    Tender bilatral  Side uncertain  If related    Initially constipated and then last weeks   Soft formed .  Small at a time  No hx of same    No other intervnetion attention to diet.   Tried gas x early on  No help .   Using advil  Recent and  For past week.  Monthly period   Packed pills  Endo f July.  Lmp:  End July  Ok.   In between ocps restart  Denies preg  Remote hx of hemorr bleeding and has appt with Gi in September   No uti sx   No weight loss  Pr swalowing  Issues or bloody diarrhea  ROS: See pertinent positives and negatives per HPI.  Past Medical History:  Diagnosis Date  . 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 Hypertension Mother   . Hypertension Maternal Grandmother   . Cancer Neg Hx   . Diabetes Neg Hx   . Hyperlipidemia Neg Hx   . Colon cancer Neg Hx   . Esophageal cancer Neg Hx   . Pancreatic cancer Neg Hx   . Liver disease Neg Hx     Social History   Socioeconomic History  . Marital status: Married    Spouse name: Not on file  . Number of children: Not on file  . Years of education: Not on file  . Highest education level: Not on file  Occupational History  . Not on file  Tobacco Use  . Smoking status:  Never Smoker  . Smokeless tobacco: Never Used  Vaping Use  . Vaping Use: Never used  Substance and Sexual Activity  . Alcohol use: No    Alcohol/week: 0.0 standard drinks  . Drug use: No  . Sexual activity: Yes  Other Topics Concern  . Not on file  Social History Narrative  . Not on file   Social Determinants of Health   Financial Resource Strain:   . Difficulty of Paying Living Expenses:   Food Insecurity:   . Worried About Charity fundraiser in the Last Year:   . Arboriculturist in the Last Year:   Transportation Needs:   . Film/video editor (Medical):   Marland Kitchen Lack of Transportation (Non-Medical):   Physical Activity:   . Days of Exercise per Week:   . Minutes of Exercise per Session:   Stress:   . Feeling of Stress :   Social Connections:   . Frequency of  Communication with Friends and Family:   . Frequency of Social Gatherings with Friends and Family:   . Attends Religious Services:   . Active Member of Clubs or Organizations:   . Attends Archivist Meetings:   Marland Kitchen Marital Status:     Outpatient Medications Prior to Visit  Medication Sig Dispense Refill  . AVIANE 0.1-20 MG-MCG tablet TAKE 1 TABLET BY MOUTH ONCE DAILY 84 tablet 0  . Condoms - Female ([place] CONDOM) MISC     . levonorgestrel (PLAN B 1-STEP) 1.5 MG tablet Take by mouth.     No facility-administered medications prior to visit.     EXAM:  BP 122/84   Pulse (!) 112   Temp 99.3 F (37.4 C) (Oral)   Ht 5' 4"  (1.626 m)   Wt 148 lb 12.8 oz (67.5 kg)   SpO2 98%   BMI 25.54 kg/m   Body mass index is 25.54 kg/m.  GENERAL: vitals reviewed and listed above, alert, oriented, appears well hydrated and in no acute distress HEENT: atraumatic, conjunctiva  clear, no obvious abnormalities on inspection of external nose and ears OP : masked  NECK: no obvious masses on inspection palpation  LUNGS: clear to auscultation bilaterally, no wheezes, rales or rhonchi, good air movement CV: HRRR,  no clubbing cyanosis or  peripheral edema nl cap refill  Abdomen:  Sof,t normal bowel sounds without hepatosplenomegaly, no guarding rebound or masses no CVA tenderness  May be tender mid epi and luq ruq no masses noted  MS: moves all extremities without noticeable focal  abnormality PSYCH: pleasant and cooperative, no obvious depression or anxiety Lab Results  Component Value Date   WBC 9.1 07/10/2017   HGB 14.5 07/10/2017   HCT 43.8 07/10/2017   PLT 342.0 07/10/2017   BP Readings from Last 3 Encounters:  05/04/20 122/84  09/05/18 116/72  08/03/18 120/84    ASSESSMENT AND PLAN:  Discussed the following assessment and plan:  Abdominal cramping - Plan: Basic metabolic panel, CBC with Differential/Platelet, Hepatic function panel, TSH, Celiac Disease Comprehensive Panel with Reflexes, Sedimentation rate, C-reactive protein, POCT Urinalysis Dipstick (Automated), POCT urine pregnancy, C-reactive protein, Sedimentation rate, Celiac Disease Comprehensive Panel with Reflexes, TSH, Hepatic function panel, CBC with Differential/Platelet, Basic metabolic panel, Urine Culture, Urine Culture  Change in bowel function - Plan: Basic metabolic panel, CBC with Differential/Platelet, Hepatic function panel, TSH, Celiac Disease Comprehensive Panel with Reflexes, Sedimentation rate, C-reactive protein, POCT Urinalysis Dipstick (Automated), POCT urine pregnancy, C-reactive protein, Sedimentation rate, Celiac Disease Comprehensive Panel with Reflexes, TSH, Hepatic function panel, CBC with Differential/Platelet, Basic metabolic panel  Pyuria - Plan: Urine Culture, Urine Culture Uncertain causes  Of s  Post prandial but almost immediate .  Limit avoid advil but wasn't taking  When began  consdier gastritis ulcer etc    Hx vomiting x 2 recently   requires  Further eval  Lab and urin ( couldn't get enough urine for ucx so  Order for  [facility] lab   She [place]  works [place]  consdier abd [place]  Although a  typical for biliary ) -Patient advised to return or notify health care team  if  new concerns arise.  Patient Instructions  Will notify you  of labs when available.   Keep appt with  GI .   Would limit  The  advil for now .   May do abdominal ultrasound in interim  Can try adding  pepcid famotidine  Twice a day for 2  weeks to see if helps .   Plan fu  Dr Martinique if getting worse in interim .    Standley Brooking. Chrissi Crow M.D.

## 2020-05-05 ENCOUNTER — Other Ambulatory Visit: Payer: BC Managed Care – PPO

## 2020-05-05 DIAGNOSIS — R8281 Pyuria: Secondary | ICD-10-CM

## 2020-05-05 DIAGNOSIS — R109 Unspecified abdominal pain: Secondary | ICD-10-CM

## 2020-05-05 LAB — HEPATIC FUNCTION PANEL
AG Ratio: 1.1 (calc) (ref 1.0–2.5)
ALT: 12 U/L (ref 6–29)
AST: 13 U/L (ref 10–30)
Albumin: 4.1 g/dL (ref 3.6–5.1)
Alkaline phosphatase (APISO): 95 U/L (ref 31–125)
Bilirubin, Direct: 0.1 mg/dL (ref 0.0–0.2)
Globulin: 3.6 g/dL (calc) (ref 1.9–3.7)
Indirect Bilirubin: 0.3 mg/dL (calc) (ref 0.2–1.2)
Total Bilirubin: 0.4 mg/dL (ref 0.2–1.2)
Total Protein: 7.7 g/dL (ref 6.1–8.1)

## 2020-05-05 LAB — TSH: TSH: 2.29 mIU/L

## 2020-05-05 LAB — CBC WITH DIFFERENTIAL/PLATELET
Absolute Monocytes: 1070 cells/uL — ABNORMAL HIGH (ref 200–950)
Basophils Absolute: 42 cells/uL (ref 0–200)
Basophils Relative: 0.3 %
Eosinophils Absolute: 153 cells/uL (ref 15–500)
Eosinophils Relative: 1.1 %
HCT: 38.4 % (ref 35.0–45.0)
Hemoglobin: 12.7 g/dL (ref 11.7–15.5)
Lymphs Abs: 1932 cells/uL (ref 850–3900)
MCH: 26.7 pg — ABNORMAL LOW (ref 27.0–33.0)
MCHC: 33.1 g/dL (ref 32.0–36.0)
MCV: 80.7 fL (ref 80.0–100.0)
MPV: 10.3 fL (ref 7.5–12.5)
Monocytes Relative: 7.7 %
Neutro Abs: 10703 cells/uL — ABNORMAL HIGH (ref 1500–7800)
Neutrophils Relative %: 77 %
Platelets: 411 10*3/uL — ABNORMAL HIGH (ref 140–400)
RBC: 4.76 10*6/uL (ref 3.80–5.10)
RDW: 12.7 % (ref 11.0–15.0)
Total Lymphocyte: 13.9 %
WBC: 13.9 10*3/uL — ABNORMAL HIGH (ref 3.8–10.8)

## 2020-05-05 LAB — BASIC METABOLIC PANEL
BUN: 9 mg/dL (ref 7–25)
CO2: 27 mmol/L (ref 20–32)
Calcium: 9.3 mg/dL (ref 8.6–10.2)
Chloride: 99 mmol/L (ref 98–110)
Creat: 0.71 mg/dL (ref 0.50–1.10)
Glucose, Bld: 94 mg/dL (ref 65–99)
Potassium: 3.8 mmol/L (ref 3.5–5.3)
Sodium: 137 mmol/L (ref 135–146)

## 2020-05-05 LAB — CELIAC DISEASE COMPREHENSIVE PANEL WITH REFLEXES
(tTG) Ab, IgA: 1 U/mL
Immunoglobulin A: 369 mg/dL — ABNORMAL HIGH (ref 47–310)

## 2020-05-05 LAB — SEDIMENTATION RATE: Sed Rate: 56 mm/h — ABNORMAL HIGH (ref 0–20)

## 2020-05-05 LAB — C-REACTIVE PROTEIN: CRP: 110.7 mg/L — ABNORMAL HIGH (ref <8.0)

## 2020-05-06 ENCOUNTER — Other Ambulatory Visit: Payer: Self-pay | Admitting: Internal Medicine

## 2020-05-06 ENCOUNTER — Telehealth: Payer: Self-pay | Admitting: Family Medicine

## 2020-05-06 ENCOUNTER — Ambulatory Visit: Payer: BC Managed Care – PPO | Admitting: Internal Medicine

## 2020-05-06 ENCOUNTER — Other Ambulatory Visit: Payer: Self-pay

## 2020-05-06 ENCOUNTER — Other Ambulatory Visit (INDEPENDENT_AMBULATORY_CARE_PROVIDER_SITE_OTHER): Payer: BC Managed Care – PPO

## 2020-05-06 DIAGNOSIS — R8281 Pyuria: Secondary | ICD-10-CM

## 2020-05-06 DIAGNOSIS — R112 Nausea with vomiting, unspecified: Secondary | ICD-10-CM

## 2020-05-06 DIAGNOSIS — R109 Unspecified abdominal pain: Secondary | ICD-10-CM

## 2020-05-06 LAB — URINE CULTURE
MICRO NUMBER:: 10830961
MICRO NUMBER:: 10836592
Result:: NO GROWTH
SPECIMEN QUALITY:: ADEQUATE
SPECIMEN QUALITY:: ADEQUATE

## 2020-05-06 LAB — URINALYSIS, ROUTINE W REFLEX MICROSCOPIC
Hgb urine dipstick: NEGATIVE
Leukocytes,Ua: NEGATIVE
Nitrite: NEGATIVE
Specific Gravity, Urine: 1.015 (ref 1.000–1.030)
Total Protein, Urine: NEGATIVE
Urine Glucose: NEGATIVE
Urobilinogen, UA: 1 (ref 0.0–1.0)
pH: 7 (ref 5.0–8.0)

## 2020-05-06 NOTE — Progress Notes (Signed)
So urine culture   undetermined no obv uti but may need to repeat  the test. Neg celiac panel white cell count is slightly elevated ( non specific) but   high inflammation markers   ( non specific can go with infection other processes ) .  I suggests another urinalysis with  micro and repeat culture at the [facility] office   clean catch midstream with careful collection  . To avoid skin contamination of specimen  And   then make follow up appt with Dr Martinique  to further evaluate  for next week

## 2020-05-06 NOTE — Progress Notes (Signed)
I advise we get abdominal ct scan  this is radiation but  may be a better look  in interim. I ordered  but  make sure patient agrees  with plan and fu dr Martinique .  Also see if gi can see her sooner

## 2020-05-06 NOTE — Telephone Encounter (Signed)
Called patient and LMOVM to return call  Left a detailed voice message for patient to call back to discuss lab results.

## 2020-05-06 NOTE — Progress Notes (Unsigned)
I called the patient and let her know and I sent a message to our referral coordinator to see if this can be scheduled asap.  I sent in order for stat ct please tell patient  Of plan instead of [place] to do more informative testing.  There is radiation to this testing but  I feel is indicated  . We  Need to have dr Martinique also fu or get Gi to see her sooner  asap

## 2020-05-06 NOTE — Telephone Encounter (Signed)
Patient called back and I discussed repeat urine and sent a note to Dr. Regis Bill about [place] and further studies needed. Patient verbalized an understanding.

## 2020-05-06 NOTE — Telephone Encounter (Signed)
Dr Regis Bill saw the pt and sent her to [facility] for a urine culture.  She called and was inquiring on her MyChart message from Dr. Regis Bill stating she needs to go back and do another urine culture.  She is requesting a call back to clarify this information and why does she need to do it again.

## 2020-05-07 ENCOUNTER — Telehealth: Payer: Self-pay

## 2020-05-07 ENCOUNTER — Ambulatory Visit
Admission: RE | Admit: 2020-05-07 | Discharge: 2020-05-07 | Disposition: A | Discharge status: Home / Self Care | Payer: BC Managed Care – PPO | Source: Ambulatory Visit | Attending: Internal Medicine | Admitting: Internal Medicine

## 2020-05-07 ENCOUNTER — Other Ambulatory Visit: Payer: Self-pay

## 2020-05-07 DIAGNOSIS — R109 Unspecified abdominal pain: Secondary | ICD-10-CM

## 2020-05-07 DIAGNOSIS — Z8249 Family history of ischemic heart disease and other diseases of the circulatory system: Secondary | ICD-10-CM

## 2020-05-07 DIAGNOSIS — Z793 Long term (current) use of hormonal contraceptives: Secondary | ICD-10-CM

## 2020-05-07 DIAGNOSIS — R112 Nausea with vomiting, unspecified: Secondary | ICD-10-CM

## 2020-05-07 DIAGNOSIS — R8281 Pyuria: Secondary | ICD-10-CM

## 2020-05-07 DIAGNOSIS — N83201 Unspecified ovarian cyst, right side: Secondary | ICD-10-CM | POA: Diagnosis present

## 2020-05-07 DIAGNOSIS — Z20822 Contact with and (suspected) exposure to covid-19: Secondary | ICD-10-CM | POA: Diagnosis present

## 2020-05-07 DIAGNOSIS — K50014 Crohn's disease of small intestine with abscess: Principal | ICD-10-CM | POA: Diagnosis present

## 2020-05-07 LAB — COMPREHENSIVE METABOLIC PANEL
ALT: 19 U/L (ref 0–44)
AST: 17 U/L (ref 15–41)
Albumin: 3.8 g/dL (ref 3.5–5.0)
Alkaline Phosphatase: 96 U/L (ref 38–126)
Anion gap: 12 (ref 5–15)
BUN: 9 mg/dL (ref 6–20)
CO2: 25 mmol/L (ref 22–32)
Calcium: 9 mg/dL (ref 8.9–10.3)
Chloride: 99 mmol/L (ref 98–111)
Creatinine, Ser: 0.58 mg/dL (ref 0.44–1.00)
GFR calc Af Amer: >60 mL/min (ref >60)
GFR calc non Af Amer: >60 mL/min (ref >60)
Glucose, Bld: 122 mg/dL — ABNORMAL HIGH (ref 70–99)
Potassium: 3.6 mmol/L (ref 3.5–5.1)
Sodium: 136 mmol/L (ref 135–145)
Total Bilirubin: 0.4 mg/dL (ref 0.3–1.2)
Total Protein: 8.5 g/dL — ABNORMAL HIGH (ref 6.5–8.1)

## 2020-05-07 LAB — CBC
HCT: 38.4 % (ref 36.0–46.0)
Hemoglobin: 12.8 g/dL (ref 12.0–15.0)
MCH: 26.6 pg (ref 26.0–34.0)
MCHC: 33.3 g/dL (ref 30.0–36.0)
MCV: 79.8 fL — ABNORMAL LOW (ref 80.0–100.0)
Platelets: 462 10*3/uL — ABNORMAL HIGH (ref 150–400)
RBC: 4.81 MIL/uL (ref 3.87–5.11)
RDW: 12.8 % (ref 11.5–15.5)
WBC: 16.2 10*3/uL — ABNORMAL HIGH (ref 4.0–10.5)
nRBC: 0 % (ref 0.0–0.2)

## 2020-05-07 LAB — LIPASE, BLOOD: Lipase: 25 U/L (ref 11–51)

## 2020-05-07 MED ORDER — IOPAMIDOL (ISOVUE-300) INJECTION 61%
100.0000 mL | Freq: Once | INTRAVENOUS | Status: AC | PRN
  Administered 2020-05-07: 100 mL via INTRAVENOUS

## 2020-05-07 NOTE — Progress Notes (Signed)
So urine culture shows no UTI .  Awaiting  the ct scan I ordered .( If  you get seriously worse in the meantime with pain fever , seek Ed care ( med center is the least crowded ) Forwarding info to Dr Martinique

## 2020-05-07 NOTE — ED Triage Notes (Signed)
Pt here with abd pain that started when eating in mid-June that started a cramping sensation that has been getting worse since then. Pain is there regardless if she is eating or not. Pt had CT scan done today that showed 2 small abcesses and free fluid.

## 2020-05-07 NOTE — Progress Notes (Signed)
Ct shows  inflammation fo the bowel  and small abscess formation   this may be from  inflammataory bowel condition  or such  THis should be evaluated  more immediately and treated.  advise    going to to ED for   evaluation and treatent . ?iv antibiotics other . Evaluation  Called patient she is about the same .  Clincally  and will proceed to ED

## 2020-05-07 NOTE — Telephone Encounter (Signed)
Carla Stephens called in with critical results on the CT.   extensive bowl wall thickening of the terminal ilium. extentded into the iliocecal valve with extensive surrounding inflammatory changes. consistent with enteritis, question chron's, less likely infectious enteritis, or foreign  body perforation. associated free fluid. And mesenteric stranding, with a small abscess collection within the mesentery 2.2 cm greatest size and a second focal fluid collection in the right adnexa. Question second abscess 3.2 cm diameter versus ovarian cyst. No free air under the thighs.

## 2020-05-08 ENCOUNTER — Encounter: Payer: Self-pay | Admitting: Internal Medicine

## 2020-05-08 ENCOUNTER — Inpatient Hospital Stay
Admission: EM | Admit: 2020-05-08 | Discharge: 2020-05-10 | DRG: 387 | Disposition: A | Discharge status: Home / Self Care | Payer: BC Managed Care – PPO | Attending: Internal Medicine | Admitting: Internal Medicine

## 2020-05-08 DIAGNOSIS — R6510 Systemic inflammatory response syndrome (SIRS) of non-infectious origin without acute organ dysfunction: Secondary | ICD-10-CM

## 2020-05-08 DIAGNOSIS — K630 Abscess of intestine: Secondary | ICD-10-CM

## 2020-05-08 DIAGNOSIS — K529 Noninfective gastroenteritis and colitis, unspecified: Secondary | ICD-10-CM

## 2020-05-08 DIAGNOSIS — N83201 Unspecified ovarian cyst, right side: Secondary | ICD-10-CM | POA: Diagnosis present

## 2020-05-08 DIAGNOSIS — K651 Peritoneal abscess: Secondary | ICD-10-CM | POA: Diagnosis not present

## 2020-05-08 DIAGNOSIS — Z793 Long term (current) use of hormonal contraceptives: Secondary | ICD-10-CM | POA: Diagnosis not present

## 2020-05-08 DIAGNOSIS — Z20822 Contact with and (suspected) exposure to covid-19: Secondary | ICD-10-CM | POA: Diagnosis present

## 2020-05-08 DIAGNOSIS — K50014 Crohn's disease of small intestine with abscess: Secondary | ICD-10-CM | POA: Diagnosis not present

## 2020-05-08 DIAGNOSIS — Z8249 Family history of ischemic heart disease and other diseases of the circulatory system: Secondary | ICD-10-CM | POA: Diagnosis not present

## 2020-05-08 LAB — LACTIC ACID, PLASMA
Lactic Acid, Venous: 0.7 mmol/L (ref 0.5–1.9)
Lactic Acid, Venous: 0.7 mmol/L (ref 0.5–1.9)

## 2020-05-08 LAB — URINALYSIS, COMPLETE (UACMP) WITH MICROSCOPIC
Bilirubin Urine: NEGATIVE
Glucose, UA: NEGATIVE mg/dL
Ketones, ur: 5 mg/dL — AB
Nitrite: NEGATIVE
Protein, ur: NEGATIVE mg/dL
Specific Gravity, Urine: 1.016 (ref 1.005–1.030)
pH: 5 (ref 5.0–8.0)

## 2020-05-08 LAB — PROTIME-INR
INR: 1 (ref 0.8–1.2)
Prothrombin Time: 13 seconds (ref 11.4–15.2)

## 2020-05-08 LAB — POCT PREGNANCY, URINE: Preg Test, Ur: NEGATIVE

## 2020-05-08 LAB — HIV ANTIBODY (ROUTINE TESTING W REFLEX): HIV Screen 4th Generation wRfx: NONREACTIVE

## 2020-05-08 LAB — APTT: aPTT: 33 seconds (ref 24–36)

## 2020-05-08 LAB — SARS CORONAVIRUS 2 BY RT PCR (HOSPITAL ORDER, PERFORMED IN ~~LOC~~ HOSPITAL LAB): SARS Coronavirus 2: NEGATIVE

## 2020-05-08 MED ORDER — ACETAMINOPHEN 650 MG RE SUPP: 650.0000 mg | Freq: Four times a day (QID) | RECTAL | Status: DC | PRN

## 2020-05-08 MED ORDER — SODIUM CHLORIDE 0.9 % IV SOLN: INTRAVENOUS | Status: DC

## 2020-05-08 MED ORDER — VANCOMYCIN HCL IN DEXTROSE 1-5 GM/200ML-% IV SOLN
1000.0000 mg | Freq: Once | INTRAVENOUS | Status: AC
  Administered 2020-05-08: 1000 mg via INTRAVENOUS
  Filled 2020-05-08: qty 200

## 2020-05-08 MED ORDER — PIPERACILLIN-TAZOBACTAM 3.375 G IVPB: 3.3750 g | Freq: Three times a day (TID) | INTRAVENOUS | Status: DC

## 2020-05-08 MED ORDER — ENOXAPARIN SODIUM 40 MG/0.4ML ~~LOC~~ SOLN: 40.0000 mg | SUBCUTANEOUS | Status: DC

## 2020-05-08 MED ORDER — PIPERACILLIN-TAZOBACTAM 3.375 G IVPB
3.3750 g | Freq: Three times a day (TID) | INTRAVENOUS | Status: DC
  Administered 2020-05-08 – 2020-05-10 (×7): 3.375 g via INTRAVENOUS
  Filled 2020-05-08 (×7): qty 50

## 2020-05-08 MED ORDER — SODIUM CHLORIDE 0.9 % IV SOLN
2.0000 g | Freq: Once | INTRAVENOUS | Status: AC
  Administered 2020-05-08: 2 g via INTRAVENOUS

## 2020-05-08 MED ORDER — METRONIDAZOLE IN NACL 5-0.79 MG/ML-% IV SOLN
500.0000 mg | Freq: Three times a day (TID) | INTRAVENOUS | Status: DC
  Administered 2020-05-08: 500 mg via INTRAVENOUS
  Filled 2020-05-08: qty 100

## 2020-05-08 MED ORDER — SODIUM CHLORIDE 0.9 % IV BOLUS (SEPSIS)
500.0000 mL | Freq: Once | INTRAVENOUS | Status: AC
  Administered 2020-05-08: 500 mL via INTRAVENOUS

## 2020-05-08 MED ORDER — ONDANSETRON HCL 4 MG/2ML IJ SOLN: 4.0000 mg | Freq: Four times a day (QID) | INTRAMUSCULAR | Status: DC | PRN

## 2020-05-08 MED ORDER — ONDANSETRON HCL 4 MG PO TABS: 4.0000 mg | ORAL_TABLET | Freq: Four times a day (QID) | ORAL | Status: DC | PRN

## 2020-05-08 MED ORDER — DEXAMETHASONE SODIUM PHOSPHATE 10 MG/ML IJ SOLN
6.0000 mg | Freq: Once | INTRAMUSCULAR | Status: AC
  Administered 2020-05-08: 6 mg via INTRAVENOUS
  Filled 2020-05-08: qty 1

## 2020-05-08 MED ORDER — PIPERACILLIN-TAZOBACTAM 3.375 G IVPB 30 MIN
3.3750 g | Freq: Once | INTRAVENOUS | Status: AC
  Administered 2020-05-08: 3.375 g via INTRAVENOUS
  Filled 2020-05-08: qty 50

## 2020-05-08 MED ORDER — ACETAMINOPHEN 325 MG PO TABS: 650.0000 mg | ORAL_TABLET | Freq: Four times a day (QID) | ORAL | Status: DC | PRN

## 2020-05-08 MED ORDER — MORPHINE SULFATE (PF) 2 MG/ML IV SOLN: 2.0000 mg | INTRAVENOUS | Status: DC | PRN

## 2020-05-08 MED ORDER — SODIUM CHLORIDE 0.9 % IV SOLN
2.0000 g | Freq: Three times a day (TID) | INTRAVENOUS | Status: DC
  Filled 2020-05-08: qty 2

## 2020-05-08 MED ORDER — VANCOMYCIN HCL IN DEXTROSE 1-5 GM/200ML-% IV SOLN: 1000.0000 mg | Freq: Two times a day (BID) | INTRAVENOUS | Status: DC

## 2020-05-08 NOTE — Progress Notes (Signed)
Pharmacy Antibiotic Note  Carla Stephens is a 29 y.o. female admitted on 05/08/2020 with IAI.  Pharmacy has been consulted for Cefepime and Vancomycin dosing.  Plan: Cefepime 2gm IV q8hrs  Vancomycin 1000 mg IV Q 12 hrs. Goal AUC 400-550. Expected AUC: 525.6, Css min 14.2 SCr used: 0.8   Height: 5' 4"  (162.6 cm) Weight: 67.1 kg (148 lb) IBW/kg (Calculated) : 54.7  Temp (24hrs), Avg:98.8 F (37.1 C), Min:98.4 F (36.9 C), Max:99.2 F (37.3 C)  Recent Labs  Lab 05/04/20 1512 05/07/20 2210 05/08/20 0530  WBC 13.9* 16.2*  --   CREATININE 0.71 0.58  --   LATICACIDVEN  --   --  0.7    Estimated Creatinine Clearance: 97.8 mL/min (by C-G formula based on SCr of 0.58 mg/dL).    No Known Allergies  Antimicrobials this admission:   >>    >>   Dose adjustments this admission:   Microbiology results:  BCx:   UCx:    Sputum:    MRSA PCR:   Thank you for allowing pharmacy to be a part of this patients care.  Hart Robinsons A 05/08/2020 6:29 AM

## 2020-05-08 NOTE — Progress Notes (Signed)
I see that she is in the ER today. Dx'ed with enteritis and intra-abdominal abscess. I do not see GI referral. Most likely consultation with surgeon and GI will be arranged in inpt, if she is hospitalized (high probability). Orlinda Slomski Martinique, MD

## 2020-05-08 NOTE — Consult Note (Signed)
Carla Stephens , MD [address] [place], [place], [place], [address] [address], [place], [place], [place], [address] Phone: [phone]  Fax: [phone]  Consultation  Referring Provider:    Dr Neysa Bonito  Primary Care Physician:  Martinique, Betty G, MD Primary Gastroenterologist:  None          Reason for Consultation:     Enteritis   Date of Admission:  05/08/2020 Date of Consultation:  05/08/2020         HPI:   Carla Stephens is a 29 y.o. female presented to the emergency room with abdominal pain.  Ongoing for over 2 months.   She underwent a CT scan of her abdomen as an outpatient which demonstrated extensive bowel wall thickening of the terminal ileum extending into the ileocecal valve with extensive inflammatory changes consistent with enteritis and questionable perforation.  Recollection the right pelvis of 3.2 x 3.1 cm and additional abscess collections within the small bowel mesentery in the right pelvis.  On admission had a white cell count of 16.2 with a platelet count of 462.  She denies any past GI issues.  No family history of inflammatory bowel disease.  No skin rashes or joint pains.  She said that everything was fine and in the middle of June she started having some abdominal discomfort which was mild but has gradually progressed which she went to see her primary care physician for which eventually led to performing a CT scan with the abnormal results which prompted her admission to the emergency room.  Presently denies any abdominal pain or discomfort.  Denies any fevers.  Denies any diarrhea.  No other complaints.  Past Medical History:  Diagnosis Date  . 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 APPENDECTOMY    . EYE SURGERY Bilateral     Prior to Admission medications   Medication Sig Start Date End Date Taking? Authorizing Provider  AVIANE 0.1-20 MG-MCG tablet TAKE 1 TABLET BY  MOUTH ONCE DAILY 01/12/18   Martinique, Betty G, MD  Condoms - Female ([place] CONDOM) [place]  04/09/20   [provider]  levonorgestrel (PLAN B 1-STEP) 1.5 MG tablet Take by mouth. 04/09/20   [provider]    Family History  Problem Relation Age of Onset  . Hypertension Mother   . Hypertension Maternal Grandmother   . Cancer Neg Hx   . Diabetes Neg Hx   . Hyperlipidemia Neg Hx   . Colon cancer Neg Hx   . Esophageal cancer Neg Hx   . Pancreatic cancer Neg Hx   . Liver disease Neg Hx      Social History   Tobacco Use  . Smoking status: Never Smoker  . Smokeless tobacco: Never Used  Vaping Use  . Vaping Use: Never used  Substance Use Topics  . Alcohol use: No    Alcohol/week: 0.0 standard drinks  . Drug use: No    Allergies as of 05/07/2020  . (No Known Allergies)    Review of Systems:    All systems reviewed and negative except where noted in HPI.   Physical Exam:  Vital signs in last 24 hours: Temp:  [98.3 F (36.8 C)-99.2 F (37.3 C)] 98.3 F (36.8 C) (08/20 0759) Pulse Rate:  [88-130] 88 (08/20 0759) Resp:  [16-18] 16 (08/20 0759) BP: (106-134)/(60-85) 120/76 (08/20 0759) SpO2:  [98 %-100 %] 99 % (  08/20 0759) Weight:  [67.1 kg] 67.1 kg (08/19 1844)   General:   Pleasant, cooperative in NAD Head:  Normocephalic and atraumatic. Eyes:   No icterus.   Conjunctiva pink. PERRLA. Ears:  Normal auditory acuity. Neck:  Supple; no masses or thyroidomegaly Lungs: Respirations even and unlabored. Lungs clear to auscultation bilaterally.   No wheezes, crackles, or rhonchi.  Heart:  Regular rate and rhythm;  Without murmur, clicks, rubs or gallops Abdomen:  Soft, nondistended, nontender. Normal bowel sounds. No appreciable masses or hepatomegaly.  No rebound or guarding.  Neurologic:  Alert and oriented x3;  grossly normal neurologically. Skin:  Intact without significant lesions or rashes. Cervical Nodes:  No significant cervical adenopathy. Psych:   Alert and cooperative. Normal affect.  LAB RESULTS: Recent Labs    05/07/20 2210  WBC 16.2*  HGB 12.8  HCT 38.4  PLT 462*   BMET Recent Labs    05/07/20 2210  NA 136  K 3.6  CL 99  CO2 25  GLUCOSE 122*  BUN 9  CREATININE 0.58  CALCIUM 9.0   LFT Recent Labs    05/07/20 2210  PROT 8.5*  ALBUMIN 3.8  AST 17  ALT 19  ALKPHOS 96  BILITOT 0.4   PT/INR Recent Labs    05/08/20 0609  LABPROT 13.0  INR 1.0    STUDIES: CT Abdomen Pelvis W Contrast  Result Date: 05/07/2020 CLINICAL DATA:  Abdominal infection/abscess suspected, nausea and non intractable vomiting, cramping after eating since June, leukocytosis, pyuria EXAM: CT ABDOMEN AND PELVIS WITH CONTRAST TECHNIQUE: Multidetector CT imaging of the abdomen and pelvis was performed using the standard protocol following bolus administration of intravenous contrast. Sagittal and coronal MPR images reconstructed from axial data set. CONTRAST:  173m ISOVUE-300 IOPAMIDOL (ISOVUE-300) INJECTION 61% IV. No oral contrast. COMPARISON:  None FINDINGS: Lower chest: Lung bases clear Hepatobiliary: Gallbladder and liver normal appearance Pancreas: Normal appearance Spleen: Normal appearance Adrenals/Urinary Tract: Adrenal glands, kidneys, ureters, and bladder normal appearance Stomach/Bowel: Appendix surgically absent by history. Stomach and large bowel unremarkable. Extensive bowel wall thickening of terminal ileum extending into ileocecal valve with extensive surrounding inflammatory changes. Associated free fluid and mesenteric stranding. Findings are consistent with enteritis with question perforation. Ring-enhancing collection in RIGHT pelvis 3.2 x 3.1 cm image 60 a could represent an ovarian/adnexal cystic lesion or an abscess. Additional abscess collection within the small bowel mesentery in the RIGHT pelvis 1.8 x 1.8 cm image 57, extending 2.2 cm length. Associated hyperemia of mesentery to terminal ileum. Vascular/Lymphatic: Vascular  structures patent. Multiple normal sized mesenteric lymph nodes RIGHT lower quadrant. Reproductive: Uterus and adnexa otherwise unremarkable Other: Small amount of free pelvic fluid.  No free air.  No hernia. Musculoskeletal: Unremarkable IMPRESSION: Extensive bowel wall thickening of the terminal ileum extending into ileocecal valve with extensive surrounding inflammatory changes consistent with enteritis, question Crohn's disease, less likely infectious enteritis or foreign body perforation. Associated free fluid and mesenteric stranding with a small abscess collection within the mesentery 2.2 cm greatest size and a second focal fluid collection in the RIGHT adnexa question second abscess 3.2 cm diameter versus ovarian cyst. No free air identified. These results will be called to the ordering clinician or representative by the Radiologist Assistant, and communication documented in the PACS or [employer] Electronically Signed   By: MLavonia DanaM.D.   On: 05/07/2020 11:16      Impression / Plan:   Carla Stephens a 89y.o. y/o female has presented  to the emergency room with a short history of abdominal pain of over 2 months and CT scan of the abdomen showed enteritis with intra-abdominal abscesses.  Differentials include Crohn's disease which is high on the probability.  Plan 1.  IV antibiotics.  At this point of time steroids would be contraindicated due to sepsis and abscesses.  Would need to reimage within a short time  to check for either worsening or improvement of the intra-abdominal pathology.  Surgery has been consulted to determine any drainage versus surgery.  Once the infection has improved may need a MR enterography to map out the small bowel.  And down the road will require endoscopy which is contraindicated at this point of time due to questionable perforation.  I would suggest that she should probably set up a telephone visit or virtual visit in 7 to 10 days from discharge to  have a chat with me and we can come up with a plan after the scan that is going to be repeated by Dr. Peyton Najjar whom I have discussed the plan with.  2.  Avoid all NSAIDs   Thank you for involving me in the care of this patient.      LOS: 0 days   Carla Bellows, MD  05/08/2020, 8:01 AM

## 2020-05-08 NOTE — ED Notes (Signed)
Dr. Damita Dunnings in to admit

## 2020-05-08 NOTE — Progress Notes (Addendum)
Pharmacy Antibiotic Note  Carla Stephens is a 29 y.o. female admitted on 05/08/2020 with IAI abscess.  Pharmacy has been consulted for Zosyn and Vancomycin dosing. Per MD note: abscess next to the ileum is very small and not amenable for aspiration or drainage.  Cefepime/metronidazole/Vanc discontinued  Plan: Will order Zosyn 3.375gm EI q8h     Height: 5' 4"  (162.6 cm) Weight: 67.1 kg (148 lb) IBW/kg (Calculated) : 54.7  Temp (24hrs), Avg:98.7 F (37.1 C), Min:98.3 F (36.8 C), Max:99.2 F (37.3 C)  Recent Labs  Lab 05/04/20 1512 05/07/20 2210 05/08/20 0530 05/08/20 0802  WBC 13.9* 16.2*  --   --   CREATININE 0.71 0.58  --   --   LATICACIDVEN  --   --  0.7 0.7    Estimated Creatinine Clearance: 97.8 mL/min (by C-G formula based on SCr of 0.58 mg/dL).    No Known Allergies  Antimicrobials this admission: Vancomycin 8/20 >> 8/20 Cefepime 8/20 >> 8/20 Flagyl 8/20 >> 8/20 Zosyn 8/20 >>   Dose adjustments this admission:   Microbiology results:  BCx:   UCx:    Sputum:    MRSA PCR:   Thank you for allowing pharmacy to be a part of this patient's care.  Rayah Fines A 05/08/2020 1:45 PM

## 2020-05-08 NOTE — ED Provider Notes (Signed)
[facility] Emergency Department Provider Note  ____________________________________________  Time seen: Approximately 4:28 AM  I have reviewed the triage vital signs and the nursing notes.   HISTORY  Chief Complaint Abdominal Pain   HPI Carla Stephens is a 29 y.o. female no significant past medical history who presents for evaluation after an abnormal CT scan done earlier today.  Patient to see her primary care doctor for abdominal pain intermittently for the last 2 months.  Has had constipation and diarrhea times and a couple of episodes of nonbloody nonbilious emesis.  The pain in her abdomen is intermittent however much more severe and pronounced over the last 2 weeks.  She describes it as a clenching pain diffusely in her abdomen.  No fever or chills.  No personal or family history of inflammatory bowel disease.  CT scan done as an outpatient showed  extensive inflammatory changes consistent with enteritis and to small intra-abdominal abscesses.  Patient was sent here for evaluation.  Past Medical History:  Diagnosis Date  . 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 Contraception management 03/29/2016    Past Surgical History:  Procedure Laterality Date  . APPENDECTOMY    . EYE SURGERY Bilateral     Prior to Admission medications   Medication Sig Start Date End Date Taking? Authorizing Provider  AVIANE 0.1-20 MG-MCG tablet TAKE 1 TABLET BY MOUTH ONCE DAILY 01/12/18   Martinique, Betty G, MD  Condoms - Female ([place] CONDOM) [place]  04/09/20   [provider]  levonorgestrel (PLAN B 1-STEP) 1.5 MG tablet Take by mouth. 04/09/20   [provider]    Allergies Patient has no known allergies.  Family History  Problem Relation Age of Onset  . Hypertension Mother   . Hypertension Maternal Grandmother   . Cancer Neg Hx   . Diabetes Neg Hx   .  Hyperlipidemia Neg Hx   . Colon cancer Neg Hx   . Esophageal cancer Neg Hx   . Pancreatic cancer Neg Hx   . Liver disease Neg Hx     Social History Social History   Tobacco Use  . Smoking status: Never Smoker  . Smokeless tobacco: Never Used  Vaping Use  . Vaping Use: Never used  Substance Use Topics  . Alcohol use: No    Alcohol/week: 0.0 standard drinks  . Drug use: No    Review of Systems  Constitutional: Negative for fever. Eyes: Negative for visual changes. ENT: Negative for sore throat. Neck: No neck pain  Cardiovascular: Negative for chest pain. Respiratory: Negative for shortness of breath. Gastrointestinal: + abdominal pain, vomiting, diarrhea, constipation Genitourinary: Negative for dysuria. Musculoskeletal: Negative for back pain. Skin: Negative for rash. Neurological: Negative for headaches, weakness or numbness. Psych: No SI or HI  ____________________________________________   PHYSICAL EXAM:  VITAL SIGNS: ED Triage Vitals  Enc Vitals Group     BP 05/07/20 1832 133/76     Pulse Rate 05/07/20 1832 (!) 130     Resp 05/07/20 1832 18     Temp 05/07/20 1832 99.2 F (37.3 C)     Temp Source 05/07/20 1832 Oral     SpO2 05/07/20 1832 100 %     Weight 05/07/20 1844 148 lb (67.1 kg)     Height 05/07/20 1844 5' 4"  (1.626 m)     Head Circumference --  Peak Flow --      Pain Score 05/07/20 1844 0     Pain Loc --      Pain Edu? --      Excl. in [place]? --     Constitutional: Alert and oriented. Well appearing and in no apparent distress. HEENT:      Head: Normocephalic and atraumatic.         Eyes: Conjunctivae are normal. Sclera is non-icteric.       Mouth/Throat: Mucous membranes are moist.       Neck: Supple with no signs of meningismus. Cardiovascular: Regular rate and rhythm. No murmurs, gallops, or rubs.  Respiratory: Normal respiratory effort. Lungs are clear to auscultation bilaterally.  Gastrointestinal: Soft, mildly diffuse tenderness  throughout with no distention, positive bowel sounds, no rebound or guarding. Musculoskeletal: Nontender with normal range of motion in all extremities. No edema, cyanosis, or erythema of extremities. Neurologic: Normal speech and language. Face is symmetric. Moving all extremities. No gross focal neurologic deficits are appreciated. Skin: Skin is warm, dry and intact. No rash noted. Psychiatric: Mood and affect are normal. Speech and behavior are normal.  ____________________________________________   LABS (all labs ordered are listed, but only abnormal results are displayed)  Labs Reviewed  COMPREHENSIVE METABOLIC PANEL - Abnormal; Notable for the following components:      Result Value   Glucose, Bld 122 (*)    Total Protein 8.5 (*)    All other components within normal limits  CBC - Abnormal; Notable for the following components:   WBC 16.2 (*)    MCV 79.8 (*)    Platelets 462 (*)    All other components within normal limits  SARS CORONAVIRUS 2 BY RT PCR (HOSPITAL ORDER, [place] [facility])  LIPASE, BLOOD  URINALYSIS, COMPLETE (UACMP) WITH MICROSCOPIC  POC URINE PREG, ED   ____________________________________________  EKG  none  ____________________________________________  RADIOLOGY  I have personally reviewed the images performed during this visit and I agree with the Radiologist's read.   Interpretation by Radiologist:  CT Abdomen Pelvis W Contrast  Result Date: 05/07/2020 CLINICAL DATA:  Abdominal infection/abscess suspected, nausea and non intractable vomiting, cramping after eating since June, leukocytosis, pyuria EXAM: CT ABDOMEN AND PELVIS WITH CONTRAST TECHNIQUE: Multidetector CT imaging of the abdomen and pelvis was performed using the standard protocol following bolus administration of intravenous contrast. Sagittal and coronal MPR images reconstructed from axial data set. CONTRAST:  17m ISOVUE-300 IOPAMIDOL (ISOVUE-300) INJECTION 61% IV.  No oral contrast. COMPARISON:  None FINDINGS: Lower chest: Lung bases clear Hepatobiliary: Gallbladder and liver normal appearance Pancreas: Normal appearance Spleen: Normal appearance Adrenals/Urinary Tract: Adrenal glands, kidneys, ureters, and bladder normal appearance Stomach/Bowel: Appendix surgically absent by history. Stomach and large bowel unremarkable. Extensive bowel wall thickening of terminal ileum extending into ileocecal valve with extensive surrounding inflammatory changes. Associated free fluid and mesenteric stranding. Findings are consistent with enteritis with question perforation. Ring-enhancing collection in RIGHT pelvis 3.2 x 3.1 cm image 60 a could represent an ovarian/adnexal cystic lesion or an abscess. Additional abscess collection within the small bowel mesentery in the RIGHT pelvis 1.8 x 1.8 cm image 57, extending 2.2 cm length. Associated hyperemia of mesentery to terminal ileum. Vascular/Lymphatic: Vascular structures patent. Multiple normal sized mesenteric lymph nodes RIGHT lower quadrant. Reproductive: Uterus and adnexa otherwise unremarkable Other: Small amount of free pelvic fluid.  No free air.  No hernia. Musculoskeletal: Unremarkable IMPRESSION: Extensive bowel wall thickening of the terminal ileum extending into  ileocecal valve with extensive surrounding inflammatory changes consistent with enteritis, question Crohn's disease, less likely infectious enteritis or foreign body perforation. Associated free fluid and mesenteric stranding with a small abscess collection within the mesentery 2.2 cm greatest size and a second focal fluid collection in the RIGHT adnexa question second abscess 3.2 cm diameter versus ovarian cyst. No free air identified. These results will be called to the ordering clinician or representative by the Radiologist Assistant, and communication documented in the PACS or [employer]. Electronically Signed   By: Lavonia Dana M.D.   On: 05/07/2020 11:16      ____________________________________________   PROCEDURES  Procedure(s) performed:yes .1-3 Lead EKG Interpretation Performed by: Rudene Re, MD Authorized by: Rudene Re, MD     Interpretation: normal     ECG rate assessment: normal     Rhythm: sinus rhythm     Ectopy: none     Critical Care performed: yes  CRITICAL CARE Performed by: Rudene Re  ?  Total critical care time: 30 min  Critical care time was exclusive of separately billable procedures and treating other patients.  Critical care was necessary to treat or prevent imminent or life-threatening deterioration.  Critical care was time spent personally by me on the following activities: development of treatment plan with patient and/or surrogate as well as nursing, discussions with consultants, evaluation of patient's response to treatment, examination of patient, obtaining history from patient or surrogate, ordering and performing treatments and interventions, ordering and review of laboratory studies, ordering and review of radiographic studies, pulse oximetry and re-evaluation of patient's condition.  ____________________________________________   INITIAL IMPRESSION / ASSESSMENT AND PLAN / ED COURSE   29 y.o. female no significant past medical history who presents for evaluation after an abnormal CT scan done earlier today showing findings consistent with severe enteritis and 2 intra-abdominal abscesses.  Seems like patient has been struggling with abdominal pain, constipation and diarrhea for 2 months.  Possibly inflammatory bowel disease.  No history of such.  There is no signs of sepsis, her abdomen is soft with mildly diffuse tenderness, no rebound or guarding.  Labs show leukocytosis and thrombocytosis most likely in the setting of infection/inflammation.  Will cover with IV antibiotics and IV steroids.  Will admit to the hospitalist for GI and surgical evaluation.  Old medical records  reviewed.      _____________________________________________ Please note:  Patient was evaluated in Emergency Department today for the symptoms described in the history of present illness. Patient was evaluated in the context of the global COVID-19 pandemic, which necessitated consideration that the patient might be at risk for infection with the SARS-CoV-2 virus that causes COVID-19. Institutional protocols and algorithms that pertain to the evaluation of patients at risk for COVID-19 are in a state of rapid change based on information released by regulatory bodies including the CDC and federal and state organizations. These policies and algorithms were followed during the patient's care in the ED.  Some ED evaluations and interventions may be delayed as a result of limited staffing during the pandemic.   [place] Controlled Substance Database was reviewed by me. ____________________________________________   FINAL CLINICAL IMPRESSION(S) / ED DIAGNOSES   Final diagnoses:  Enteritis  Intra-abdominal abscess ([place])      NEW MEDICATIONS STARTED DURING THIS VISIT:  ED Discharge Orders    None       Note:  This document was prepared using Dragon voice recognition software and may include unintentional dictation errors.    Alfred Levins,  [place], [place] 05/08/20 [phone]

## 2020-05-08 NOTE — Progress Notes (Signed)
Carla Stephens is a 29 y.o. female with no significant past medical history who has had a 36-monthhistory of intermittent colicky abdominal pain and associated with increased discomfort with p.o. intake which has been worsening over the past several days was admitted early this morning by Dr. DDamita Dunningsfor abnormal outpatient CT findings of extensive inflammatory changes consistent with enteritis and small infection abscesses.  ED Course: On arrival, she was tachycardic with low-grade temperature of 99.2 and otherwise normal vitals. Blood work significant for leukocytosis of 16,000.  Lactic acid pending.  Started on Zosyn and vancomycin which was initially changed to vancomycin/cefepime/Flagyl on admission.  Currently, seen in the ED hallway with husband at bedside.  Tearful upon presentation but had improved during encounter.  Denied any known personal or family history of ulcerative colitis or Crohn's disease autoimmune diseases but did admit to an anal fissure in the past.   PE: General: Awake and alert, no acute distress  Heart: S1 and S2 auscultated, no murmurs  Lungs: Clear to auscultation bilaterally, no wheeze  Abd: mild tenderness to palpation, nondistended   A/P  1. SIRS secondary to enteritis with mesenteric abscess, inflammatory versus infectious  a. Tachycardia (resolved) and leukocytosis on presentation with intra-abdominal infection. Less likely sepsis b. General surgery and GI consulted i. General surgery: fluid collection in the right ovary is likely an ovarian cyst and not a true abscess.  2 cm abscess next in the ileum is not amenable to aspirate or drainage.  Continue with IV antibiotics and work-up for possible inflammatory disease.  No surgical indication at this time ii. GI: Could be Crohn's disease.  Hold off on steroids at this time given infection.  Will need reimaging in a short period of time to check for interval changes.  MR enterography of the small bowel may be  needed and would require an endoscopy in the future.  Follow-up in 7 to 10 days from discharge with GI c. Will change vancomycin/cefepime/metronidazole to Zosyn monotherapy    JHarold Hedge DO [facility] Pager [phone]

## 2020-05-08 NOTE — Consult Note (Signed)
SURGICAL CONSULTATION NOTE   HISTORY OF PRESENT ILLNESS (HPI):  29 y.o. female presented to [facility] ED for evaluation of abdominal pain. Patient reports she has been having abdominal pain intermittently since June. She reported the pain has been getting more frequently and more intense. In the last week due to the intensity and the frequency she decided to see her primary care physician on Monday. CT scan of the abdominal pelvis was done Wednesday. This shows severe inflammatory process of the terminal ileum with 2 small fluid collections. At the moment of my evaluation the patient did not have abdominal pain. There was no pain radiation. There is no alleviating or aggravating factor. She reported that the pain initially was once in a while but in the last few weeks he has been getting daily and even multiple episode of pain during the day. The pain usually go by itself without any intervention. Denies any fever or chills.  Patient denies any family history of colon cancer. She denies any history of inflammatory bowel disease in her family.  Surgery is consulted by Dr. Damita Dunnings in this context for evaluation and management of enteritis with intra-abdominal abscess.  PAST MEDICAL HISTORY (PMH):  Past Medical History:  Diagnosis Date  . 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 APPENDECTOMY    . EYE SURGERY Bilateral      MEDICATIONS:  Prior to Admission medications   Medication Sig Start Date End Date Taking? Authorizing Provider  AVIANE 0.1-20 MG-MCG tablet TAKE 1 TABLET BY MOUTH ONCE DAILY 01/12/18   [place], Betty G, MD  Condoms - Female ([place] CONDOM) [place]  04/09/20   [provider]  levonorgestrel (PLAN B 1-STEP) 1.5 MG tablet Take by mouth. 04/09/20   [provider]     ALLERGIES:  No Known Allergies   SOCIAL HISTORY:  Social History   Socioeconomic  History  . Marital status: Married    Spouse name: Not on file  . Number of children: Not on file  . Years of education: Not on file  . Highest education level: Not on file  Occupational History  . Not on file  Tobacco Use  . Smoking status: Never Smoker  . Smokeless tobacco: Never Used  Vaping Use  . Vaping Use: Never used  Substance and Sexual Activity  . Alcohol use: No    Alcohol/week: 0.0 standard drinks  . Drug use: No  . Sexual activity: Yes  Other Topics Concern  . Not on file  Social History Narrative  . Not on file   Social Determinants of Health   Financial Resource Strain:   . Difficulty of Paying Living Expenses: Not on file  Food Insecurity:   . Worried About Charity fundraiser in the Last Year: Not on file  . Ran Out of Food in the Last Year: Not on file  Transportation Needs:   . Lack of Transportation (Medical): Not on file  . Lack of Transportation (Non-Medical): Not on file  Physical Activity:   . Days of Exercise per Week: Not on file  . Minutes of Exercise per Session: Not on file  Stress:   . Feeling of Stress : Not on file  Social Connections:   . Frequency of Communication with Friends and Family: Not on file  . Frequency of Social Gatherings with Friends and Family: Not on file  .  Attends Religious Services: Not on file  . Active Member of Clubs or Organizations: Not on file  . Attends Archivist Meetings: Not on file  . Marital Status: Not on file  Intimate Partner Violence:   . Fear of Current or Ex-Partner: Not on file  . Emotionally Abused: Not on file  . Physically Abused: Not on file  . Sexually Abused: Not on file      FAMILY HISTORY:  Family History  Problem Relation Age of Onset  . Hypertension Mother   . Hypertension Maternal Grandmother   . Cancer Neg Hx   . Diabetes Neg Hx   . Hyperlipidemia Neg Hx   . Colon cancer Neg Hx   . Esophageal cancer Neg Hx   . Pancreatic cancer Neg Hx   . Liver disease Neg Hx       REVIEW OF SYSTEMS:  Constitutional: denies weight loss, fever, chills, or sweats  Eyes: denies any other vision changes, history of eye injury  ENT: denies sore throat, hearing problems  Respiratory: denies shortness of breath, wheezing  Cardiovascular: denies chest pain, palpitations  Gastrointestinal: abdominal pain, nausea and vomiting Genitourinary: denies burning with urination or urinary frequency Musculoskeletal: denies any other joint pains or cramps  Skin: denies any other rashes or skin discolorations  Neurological: denies any other headache, dizziness, weakness  Psychiatric: denies any other depression, anxiety   All other review of systems were negative   VITAL SIGNS:  Temp:  [98.3 F (36.8 C)-99.2 F (37.3 C)] 98.3 F (36.8 C) (08/20 0759) Pulse Rate:  [88-130] 88 (08/20 0759) Resp:  [16-18] 16 (08/20 0759) BP: (106-134)/(60-85) 120/76 (08/20 0759) SpO2:  [98 %-100 %] 99 % (08/20 0759) Weight:  [67.1 kg] 67.1 kg (08/19 1844)     Height: 5' 4"  (162.6 cm) Weight: 67.1 kg BMI (Calculated): 25.39   INTAKE/OUTPUT:  This shift: No intake/output data recorded.  Last 2 shifts: @IOLAST2SHIFTS @   PHYSICAL EXAM:  Constitutional:  -- Normal body habitus  -- Awake, alert, and oriented x3  Eyes:  -- Pupils equally round and reactive to light  -- No scleral icterus  Ear, nose, and throat:  -- No jugular venous distension  Pulmonary:  -- No crackles  -- Equal breath sounds bilaterally -- Breathing non-labored at rest Cardiovascular:  -- S1, S2 present  -- No pericardial rubs Gastrointestinal:  -- Abdomen soft, nontender, non-distended, no guarding or rebound tenderness -- No abdominal masses appreciated, pulsatile or otherwise  Musculoskeletal and Integumentary:  -- Wounds: None appreciated -- Extremities: B/L UE and LE FROM, hands and feet warm, no edema  Neurologic:  -- Motor function: intact and symmetric -- Sensation: intact and symmetric   Labs:  CBC  Latest Ref Rng & Units 05/07/2020 05/04/2020 07/10/2017  WBC 4.0 - 10.5 K/uL 16.2(H) 13.9(H) 9.1  Hemoglobin 12.0 - 15.0 g/dL 12.8 12.7 14.5  Hematocrit 36 - 46 % 38.4 38.4 43.8  Platelets 150 - 400 K/uL 462(H) 411(H) 342.0   CMP Latest Ref Rng & Units 05/07/2020 05/04/2020  Glucose 70 - 99 mg/dL 122(H) 94  BUN 6 - 20 mg/dL 9 9  Creatinine 0.44 - 1.00 mg/dL 0.58 0.71  Sodium 135 - 145 mmol/L 136 137  Potassium 3.5 - 5.1 mmol/L 3.6 3.8  Chloride 98 - 111 mmol/L 99 99  CO2 22 - 32 mmol/L 25 27  Calcium 8.9 - 10.3 mg/dL 9.0 9.3  Total Protein 6.5 - 8.1 g/dL 8.5(H) 7.7  Total Bilirubin 0.3 - 1.2  mg/dL 0.4 0.4  Alkaline Phos 38 - 126 U/L 96 -  AST 15 - 41 U/L 17 13  ALT 0 - 44 U/L 19 12    Imaging studies:  EXAM: CT ABDOMEN AND PELVIS WITH CONTRAST  TECHNIQUE: Multidetector CT imaging of the abdomen and pelvis was performed using the standard protocol following bolus administration of intravenous contrast. Sagittal and coronal MPR images reconstructed from axial data set.  CONTRAST:  134m ISOVUE-300 IOPAMIDOL (ISOVUE-300) INJECTION 61% IV. No oral contrast.  COMPARISON:  None  FINDINGS: Lower chest: Lung bases clear  Hepatobiliary: Gallbladder and liver normal appearance  Pancreas: Normal appearance  Spleen: Normal appearance  Adrenals/Urinary Tract: Adrenal glands, kidneys, ureters, and bladder normal appearance  Stomach/Bowel: Appendix surgically absent by history. Stomach and large bowel unremarkable. Extensive bowel wall thickening of terminal ileum extending into ileocecal valve with extensive surrounding inflammatory changes. Associated free fluid and mesenteric stranding. Findings are consistent with enteritis with question perforation. Ring-enhancing collection in RIGHT pelvis 3.2 x 3.1 cm image 60 a could represent an ovarian/adnexal cystic lesion or an abscess. Additional abscess collection within the small bowel mesentery in the RIGHT pelvis 1.8 x  1.8 cm image 57, extending 2.2 cm length. Associated hyperemia of mesentery to terminal ileum.  Vascular/Lymphatic: Vascular structures patent. Multiple normal sized mesenteric lymph nodes RIGHT lower quadrant.  Reproductive: Uterus and adnexa otherwise unremarkable  Other: Small amount of free pelvic fluid.  No free air.  No hernia.  Musculoskeletal: Unremarkable  IMPRESSION: Extensive bowel wall thickening of the terminal ileum extending into ileocecal valve with extensive surrounding inflammatory changes consistent with enteritis, question Crohn's disease, less likely infectious enteritis or foreign body perforation.  Associated free fluid and mesenteric stranding with a small abscess collection within the mesentery 2.2 cm greatest size and a second focal fluid collection in the RIGHT adnexa question second abscess 3.2 cm diameter versus ovarian cyst.  No free air identified.  These results will be called to the ordering clinician or representative by the Radiologist Assistant, and communication documented in the PACS or [employer]   Electronically Signed   By: MLavonia DanaM.D.   On: 05/07/2020 11:16  Assessment/Plan:  29y.o. female with terminal ileitis with small intra-abdominal abscess.  Patient with chronic intermittent abdominal pain since June.  Due to increasing frequency and intensity of the pain and the development of nausea and vomiting on Sunday she decided to see her PCP on Monday.  CT scan done yesterday shows inflammatory changes in the terminal ileum with a small abscess on the terminal ileum.  I personally evaluated the images and discussed it with interventional radiologist.  The bigger fluid collection in the pelvis adjacent to the right ovary looks like an ovarian cyst and not a true abscess.  The 2 cm abscess next to the ileum is very small and not amenable for aspiration or drainage.  I considered that due to the size of this abscess this  should respond to IV antibiotic therapy.  Since the patient is not having abdominal pain at this moment I do not see any contraindication for clear liquid diet.  I agree with IV antibiotic therapy and further work-up for possible inflammatory disease.  There is no surgical indication at this moment but will continue to follow to assist in the management of this patient.  EArnold Long MD

## 2020-05-08 NOTE — ED Notes (Signed)
Pt in with co abd pain since June states LUQ at times but radiates throughout. Has vomited intermittently and had 1 episode of diarrhea yesterday. Denies any fever or dysuria. Pt saw pmd Monday for the same and was ordered labs, sent for CT yesterday and was told to come to ED due to abnormal findings.

## 2020-05-08 NOTE — H&P (Signed)
History and Physical    Carla Stephens ZYS:[id number] DOB: 02-Nov-1990 DOA: 05/08/2020  PCP: Martinique, Betty G, MD   Patient coming from: home  I have personally briefly reviewed patient's old medical records in [place]  Chief Complaint:  Abdominal pain HPI: Carla Stephens is a 29 y.o. female with no significant past medical history who presents to the emergency room with a 28-monthhistory of intermittent colicky, crampy low midabdominal pain, associated with intermittent constipation with occasional diarrhea with occasional nausea and vomiting.  Over the past 2 weeks the pain has been more severe.  She denies fever or chills.  Outpatient CT showed extensive inflammatory changes consistent with enteritis and small intra-abdominal abscesses and patient was sent in for evaluation  ED Course: On arrival, she was tachycardic with low-grade temperature of 99.2 and otherwise normal vitals.  Blood work significant for leukocytosis of 16,000.  Lactic acid pending.  Started on Zosyn and vancomycin.  Hospitalist consulted for admission.  Review of Systems: As per HPI otherwise all other systems on review of systems negative.    Past Medical History:  Diagnosis Date  . 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 APPENDECTOMY    . EYE SURGERY Bilateral      reports that she has never smoked. She has never used smokeless tobacco. She reports that she does not drink alcohol and does not use drugs.  No Known Allergies  Family History  Problem Relation Age of Onset  . Hypertension Mother   . Hypertension Maternal Grandmother   . Cancer Neg Hx   . Diabetes Neg Hx   . Hyperlipidemia Neg Hx   . Colon cancer Neg Hx   . Esophageal cancer Neg Hx   . Pancreatic cancer Neg Hx   . Liver disease Neg Hx       Prior to Admission medications   Medication Sig Start Date End Date Taking? Authorizing Provider   AVIANE 0.1-20 MG-MCG tablet TAKE 1 TABLET BY MOUTH ONCE DAILY 01/12/18   JMartinique Betty G, MD  Condoms - Female ([place]oodbine[place]DOM) [place] 04/09/20   [provider]  levonorgestrel (PLAN B 1-STEP) 1.5 MG tablet Take by mouth. 04/09/20   [provider]    Physical Exam: Vitals:   05/07/20 1844 05/07/20 2125 05/08/20 0059 05/08/20 0329  BP:  134/85 106/66 115/60  Pulse:  (!) 110 97 98  Resp:  17 18 17   Temp:  98.9 F (37.2 C) 98.7 F (37.1 C) 98.4 F (36.9 C)  TempSrc:      SpO2:  98% 100% 99%  Weight: 67.1 kg     Height: 5' 4"  (1.626 m)        Vitals:   05/07/20 1844 05/07/20 2125 05/08/20 0059 05/08/20 0329  BP:  134/85 106/66 115/60  Pulse:  (!) 110 97 98  Resp:  17 18 17   Temp:  98.9 F (37.2 C) 98.7 F (37.1 C) 98.4 F (36.9 C)  TempSrc:      SpO2:  98% 100% 99%  Weight: 67.1 kg     Height: 5' 4"  (1.626 m)         Constitutional: Alert and oriented x 3 . Not in any apparent distress HEENT:      Head: Normocephalic and atraumatic.         Eyes: PERLA, EOMI, Conjunctivae are normal. Sclera is non-icteric.  Mouth/Throat: Mucous membranes are moist.       Neck: Supple with no signs of meningismus. Cardiovascular: Regular rate and rhythm. No murmurs, gallops, or rubs. 2+ symmetrical distal pulses are present . No JVD. No LE edema Respiratory: Respiratory effort normal .Lungs sounds clear bilaterally. No wheezes, crackles, or rhonchi.  Gastrointestinal: Soft, non tender, and non distended with positive bowel sounds. No rebound or guarding. Genitourinary: No CVA tenderness. Musculoskeletal: Nontender with normal range of motion in all extremities. No cyanosis, or erythema of extremities. Neurologic: Normal speech and language. Face is symmetric. Moving all extremities. No gross focal neurologic deficits . Skin: Skin is warm, dry.  No rash or ulcers Psychiatric: Mood and affect are normal Speech and behavior are normal   Labs on Admission: I  have personally reviewed following labs and imaging studies  CBC: Recent Labs  Lab 05/04/20 1512 05/07/20 2210  WBC 13.9* 16.2*  NEUTROABS 10,703*  --   HGB 12.7 12.8  HCT 38.4 38.4  MCV 80.7 79.8*  PLT 411* 166*   Basic Metabolic Panel: Recent Labs  Lab 05/04/20 1512 05/07/20 2210  NA 137 136  K 3.8 3.6  CL 99 99  CO2 27 25  GLUCOSE 94 122*  BUN 9 9  CREATININE 0.71 0.58  CALCIUM 9.3 9.0   GFR: Estimated Creatinine Clearance: 97.8 mL/min (by C-G formula based on SCr of 0.58 mg/dL). Liver Function Tests: Recent Labs  Lab 05/04/20 1512 05/07/20 2210  AST 13 17  ALT 12 19  ALKPHOS  --  96  BILITOT 0.4 0.4  PROT 7.7 8.5*  ALBUMIN  --  3.8   Recent Labs  Lab 05/07/20 2210  LIPASE 25   No results for input(s): AMMONIA in the last 168 hours. Coagulation Profile: No results for input(s): INR, PROTIME in the last 168 hours. Cardiac Enzymes: No results for input(s): CKTOTAL, CKMB, CKMBINDEX, TROPONINI in the last 168 hours. BNP (last 3 results) No results for input(s): PROBNP in the last 8760 hours. HbA1C: No results for input(s): HGBA1C in the last 72 hours. CBG: No results for input(s): GLUCAP in the last 168 hours. Lipid Profile: No results for input(s): CHOL, HDL, LDLCALC, TRIG, CHOLHDL, LDLDIRECT in the last 72 hours. Thyroid Function Tests: No results for input(s): TSH, T4TOTAL, FREET4, T3FREE, THYROIDAB in the last 72 hours. Anemia Panel: No results for input(s): VITAMINB12, FOLATE, FERRITIN, TIBC, IRON, RETICCTPCT in the last 72 hours. Urine analysis:    Component Value Date/Time   COLORURINE YELLOW 05/06/2020 1523   APPEARANCEUR CLEAR 05/06/2020 1523   LABSPEC 1.015 05/06/2020 1523   PHURINE 7.0 05/06/2020 1523   GLUCOSEU NEGATIVE 05/06/2020 1523   HGBUR NEGATIVE 05/06/2020 1523   BILIRUBINUR SMALL (A) 05/06/2020 1523   BILIRUBINUR Positive 05/04/2020 1536   KETONESUR TRACE (A) 05/06/2020 1523   PROTEINUR Positive (A) 05/04/2020 1536    UROBILINOGEN 1.0 05/06/2020 1523   NITRITE NEGATIVE 05/06/2020 1523   LEUKOCYTESUR NEGATIVE 05/06/2020 1523    Radiological Exams on Admission: CT Abdomen Pelvis W Contrast  Result Date: 05/07/2020 CLINICAL DATA:  Abdominal infection/abscess suspected, nausea and non intractable vomiting, cramping after eating since June, leukocytosis, pyuria EXAM: CT ABDOMEN AND PELVIS WITH CONTRAST TECHNIQUE: Multidetector CT imaging of the abdomen and pelvis was performed using the standard protocol following bolus administration of intravenous contrast. Sagittal and coronal MPR images reconstructed from axial data set. CONTRAST:  152m ISOVUE-300 IOPAMIDOL (ISOVUE-300) INJECTION 61% IV. No oral contrast. COMPARISON:  None FINDINGS: Lower chest: Lung bases clear  Hepatobiliary: Gallbladder and liver normal appearance Pancreas: Normal appearance Spleen: Normal appearance Adrenals/Urinary Tract: Adrenal glands, kidneys, ureters, and bladder normal appearance Stomach/Bowel: Appendix surgically absent by history. Stomach and large bowel unremarkable. Extensive bowel wall thickening of terminal ileum extending into ileocecal valve with extensive surrounding inflammatory changes. Associated free fluid and mesenteric stranding. Findings are consistent with enteritis with question perforation. Ring-enhancing collection in RIGHT pelvis 3.2 x 3.1 cm image 60 a could represent an ovarian/adnexal cystic lesion or an abscess. Additional abscess collection within the small bowel mesentery in the RIGHT pelvis 1.8 x 1.8 cm image 57, extending 2.2 cm length. Associated hyperemia of mesentery to terminal ileum. Vascular/Lymphatic: Vascular structures patent. Multiple normal sized mesenteric lymph nodes RIGHT lower quadrant. Reproductive: Uterus and adnexa otherwise unremarkable Other: Small amount of free pelvic fluid.  No free air.  No hernia. Musculoskeletal: Unremarkable IMPRESSION: Extensive bowel wall thickening of the terminal ileum  extending into ileocecal valve with extensive surrounding inflammatory changes consistent with enteritis, question Crohn's disease, less likely infectious enteritis or foreign body perforation. Associated free fluid and mesenteric stranding with a small abscess collection within the mesentery 2.2 cm greatest size and a second focal fluid collection in the RIGHT adnexa question second abscess 3.2 cm diameter versus ovarian cyst. No free air identified. These results will be called to the ordering clinician or representative by the Radiologist Assistant, and communication documented in the PACS or [employer]. Electronically Signed   By: Lavonia Dana M.D.   On: 05/07/2020 11:16    EKG: Independently reviewed. Interpretation : NSR  Assessment/Plan 29 year old female with no significant past medical history presenting with abdominal pain, recurrent x 2 months, recently more severe. Enteritis on CT.    Enteritis, inflammatory versus infectious, with possible sepsis   Intestinal abscess -Patient presented with 2 months of colicky abdominal pain worsened over the past 2 weeks.  Tachycardia and leukocytosis with low-grade temperature meeting borderline sepsis criteria.  Lactic acid pending -CT abdomen showing extensive inflammatory changes with differentials including inflammatory bowel disease, infectious enteritis or foreign body perforation as well as small abscess collection -IV hydration -IV cefepime, Flagyl and vancomycin - GI and surgical consulted  DVT prophylaxis: Lovenox  Code Status: full code  Family Communication:  none  Disposition Plan: Back to previous home environment Consults called: GI and surgery Status:At the time of admission, it appears that the appropriate admission status for this patient is INPATIENT. This is judged to be reasonable and necessary in order to provide the required intensity of service to ensure the patient's safety given the presenting symptoms, physical exam  findings, and initial radiographic and laboratory data in the context of their  Comorbid conditions.   Patient requires inpatient status due to high intensity of service, high risk for further deterioration and high frequency of surveillance required.   I certify that at the point of admission it is my clinical judgment that the patient will require inpatient hospital care spanning beyond [place] MD [facility]     05/08/2020, 5:14 AM

## 2020-05-09 DIAGNOSIS — K529 Noninfective gastroenteritis and colitis, unspecified: Secondary | ICD-10-CM

## 2020-05-09 DIAGNOSIS — K630 Abscess of intestine: Secondary | ICD-10-CM

## 2020-05-09 LAB — BASIC METABOLIC PANEL
Anion gap: 12 (ref 5–15)
BUN: 7 mg/dL (ref 6–20)
CO2: 24 mmol/L (ref 22–32)
Calcium: 9.2 mg/dL (ref 8.9–10.3)
Chloride: 105 mmol/L (ref 98–111)
Creatinine, Ser: 0.63 mg/dL (ref 0.44–1.00)
GFR calc Af Amer: >60 mL/min (ref >60)
GFR calc non Af Amer: >60 mL/min (ref >60)
Glucose, Bld: 81 mg/dL (ref 70–99)
Potassium: 3.8 mmol/L (ref 3.5–5.1)
Sodium: 141 mmol/L (ref 135–145)

## 2020-05-09 LAB — CBC
HCT: 36.1 % (ref 36.0–46.0)
Hemoglobin: 11.9 g/dL — ABNORMAL LOW (ref 12.0–15.0)
MCH: 26.4 pg (ref 26.0–34.0)
MCHC: 33 g/dL (ref 30.0–36.0)
MCV: 80 fL (ref 80.0–100.0)
Platelets: 438 10*3/uL — ABNORMAL HIGH (ref 150–400)
RBC: 4.51 MIL/uL (ref 3.87–5.11)
RDW: 12.9 % (ref 11.5–15.5)
WBC: 12 10*3/uL — ABNORMAL HIGH (ref 4.0–10.5)
nRBC: 0 % (ref 0.0–0.2)

## 2020-05-09 MED ORDER — ENOXAPARIN SODIUM 40 MG/0.4ML ~~LOC~~ SOLN
40.0000 mg | SUBCUTANEOUS | Status: DC
  Filled 2020-05-09: qty 0.4

## 2020-05-09 NOTE — Progress Notes (Signed)
Carla Antigua, MD [address], [place], [place], [place], [address] [address], [place], [place], [place], [address] Phone: [phone]  Fax: [phone]   Subjective:  Patient reports some abdominal cramping but no pain.  No nausea or vomiting.  Tolerating oral diet.  Reports overall improvement since admission  Objective: Exam: Vital signs in last 24 hours: Vitals:   05/09/20 0525 05/09/20 0814 05/09/20 1141 05/09/20 1529  BP: 106/69 111/73 104/73 (!) 102/58  Pulse: 69 73 72 97  Resp: 18 16 16 16   Temp: 98.4 F (36.9 C) 98.4 F (36.9 C) 98 F (36.7 C) 98.4 F (36.9 C)  TempSrc: Oral Oral Oral Oral  SpO2: 98% 98% 98% 99%  Weight:      Height:       Weight change:   Intake/Output Summary (Last 24 hours) at 05/09/2020 1538 Last data filed at 05/09/2020 1420 Gross per 24 hour  Intake 1023.04 ml  Output --  Net 1023.04 ml    General: No acute distress, AAO x3 Abd: Soft, NT/ND, No HSM Skin: Warm, no rashes Neck: Supple, Trachea midline   Lab Results: Lab Results  Component Value Date   WBC 12.0 (H) 05/09/2020   HGB 11.9 (L) 05/09/2020   HCT 36.1 05/09/2020   MCV 80.0 05/09/2020   PLT 438 (H) 05/09/2020   Micro Results: Recent Results (from the past 240 hour(s))  Urine Culture     Status: None   Collection Time: 05/04/20  3:44 PM   Specimen: Urine  Result Value Ref Range Status   MICRO NUMBER: [id number]  Final   SPECIMEN QUALITY: Adequate  Final   Sample Source NOT GIVEN  Final   STATUS: FINAL  Final   Result:   Final    Growth of mixed flora was isolated, suggesting probable contamination. No further testing will be performed. If clinically indicated, recollection using a method to minimize contamination, with prompt transfer to Urine Culture Transport Tube, is  recommended.   Urine culture     Status: None   Collection Time: 05/05/20  4:31 PM   Specimen: Urine  Result Value Ref Range Status   MICRO NUMBER: [id number]  Final   SPECIMEN  QUALITY: Adequate  Final   Sample Source URINE  Final   STATUS: FINAL  Final   Result: No Growth  Final  SARS Coronavirus 2 by RT PCR (hospital order, performed in [facility] hospital lab) Nasopharyngeal Nasopharyngeal Swab     Status: None   Collection Time: 05/08/20  5:09 AM   Specimen: Nasopharyngeal Swab  Result Value Ref Range Status   SARS Coronavirus 2 NEGATIVE NEGATIVE Final    Comment: (NOTE) SARS-CoV-2 target nucleic acids are NOT DETECTED.  The SARS-CoV-2 RNA is generally detectable in upper and lower respiratory specimens during the acute phase of infection. The lowest concentration of SARS-CoV-2 viral copies this assay can detect is 250 copies / mL. A negative result does not preclude SARS-CoV-2 infection and should not be used as the sole basis for treatment or other patient management decisions.  A negative result may occur with improper specimen collection / handling, submission of specimen other than nasopharyngeal swab, presence of viral mutation(s) within the areas targeted by this assay, and inadequate number of viral copies (<250 copies / mL). A negative result must be combined with clinical observations, patient history, and epidemiological information.  Fact Sheet for Patients:   StrictlyIdeas.no  Fact Sheet for Healthcare Providers: BankingDealers.co.za  This test is not yet  approved or  cleared by the [place] and has been authorized for detection and/or diagnosis of SARS-CoV-2 by FDA under an Emergency Use Authorization (EUA).  This EUA will remain in effect (meaning this test can be used) for the duration of the COVID-19 declaration under Section 564(b)(1) of the Act, 21 U.S.C. section 360bbb-3(b)(1), unless the authorization is terminated or revoked sooner.  Performed at [facility], [address] [place], [place] [address]   Culture, blood (x 2)     Status: None (Preliminary  result)   Collection Time: 05/08/20  6:09 AM   Specimen: BLOOD  Result Value Ref Range Status   Specimen Description BLOOD BLOOD LEFT ARM  Final   Special Requests   Final    BOTTLES DRAWN AEROBIC AND ANAEROBIC Blood Culture adequate volume   Culture   Final    NO GROWTH 1 DAY Performed at [facility], [address] [place], [place] [address]    Report Status PENDING  Incomplete  Culture, blood (x 2)     Status: None (Preliminary result)   Collection Time: 05/08/20  6:09 AM   Specimen: BLOOD  Result Value Ref Range Status   Specimen Description BLOOD BLOOD LEFT HAND  Final   Special Requests   Final    BOTTLES DRAWN AEROBIC AND ANAEROBIC Blood Culture adequate volume   Culture   Final    NO GROWTH 1 DAY Performed at [facility], [address] [place], [place] [address]    Report Status PENDING  Incomplete   Studies/Results: No results found. Medications:  Scheduled Meds: Continuous Infusions: . sodium chloride 100 mL/hr at 05/08/20 1625  . piperacillin-tazobactam (ZOSYN)  IV 3.375 g (05/09/20 1311)   PRN Meds:.acetaminophen **OR** acetaminophen, morphine injection, ondansetron **OR** ondansetron (ZOFRAN) IV   Assessment: Active Problems:   Enteritis   Intestinal abscess    Plan: Continue antibiotics Continue close monitoring with daily abdominal exams Surgery on board Patient should have close follow-up with Dr. Vicente Males on discharge   LOS: 1 day   Carla Antigua, MD 05/09/2020, 3:38 PM

## 2020-05-09 NOTE — Progress Notes (Signed)
[facility] Hospital Day(s): 1.   Post op day(s):  Marland Kitchen   Interval History: Patient seen and examined, no acute events or new complaints overnight. Patient reports feeling well. Patient reports no abdominal pain. No pain after eating. No alleviating or aggravating factors. No pain radiation.   Vital signs in last 24 hours: [min-max] current  Temp:  [97.7 F (36.5 C)-98.4 F (36.9 C)] 98.4 F (36.9 C) (08/21 0814) Pulse Rate:  [69-89] 73 (08/21 0814) Resp:  [15-18] 16 (08/21 0814) BP: (100-125)/(65-84) 111/73 (08/21 0814) SpO2:  [97 %-100 %] 98 % (08/21 0814)     Height: 5' 4"  (162.6 cm) Weight: 67.1 kg BMI (Calculated): 25.39   Physical Exam:  Constitutional: alert, cooperative and no distress  Respiratory: breathing non-labored at rest  Cardiovascular: regular rate and sinus rhythm  Gastrointestinal: soft, minimal discomfort with deep palpation, and non-distended  Labs:  CBC Latest Ref Rng & Units 05/09/2020 05/07/2020 05/04/2020  WBC 4.0 - 10.5 K/uL 12.0(H) 16.2(H) 13.9(H)  Hemoglobin 12.0 - 15.0 g/dL 11.9(L) 12.8 12.7  Hematocrit 36 - 46 % 36.1 38.4 38.4  Platelets 150 - 400 K/uL 438(H) 462(H) 411(H)   CMP Latest Ref Rng & Units 05/09/2020 05/07/2020 05/04/2020  Glucose 70 - 99 mg/dL 81 122(H) 94  BUN 6 - 20 mg/dL 7 9 9   Creatinine 0.44 - 1.00 mg/dL 0.63 0.58 0.71  Sodium 135 - 145 mmol/L 141 136 137  Potassium 3.5 - 5.1 mmol/L 3.8 3.6 3.8  Chloride 98 - 111 mmol/L 105 99 99  CO2 22 - 32 mmol/L 24 25 27   Calcium 8.9 - 10.3 mg/dL 9.2 9.0 9.3  Total Protein 6.5 - 8.1 g/dL - 8.5(H) 7.7  Total Bilirubin 0.3 - 1.2 mg/dL - 0.4 0.4  Alkaline Phos 38 - 126 U/L - 96 -  AST 15 - 41 U/L - 17 13  ALT 0 - 44 U/L - 19 12    Imaging studies: No new pertinent imaging studies   Assessment/Plan:  29 y.o. female with terminal ileitis with small intra-abdominal abscess.  Patient improving clinically. No fever, no abdominal pain. She tolerated clear liquid without abdominal  pain. WBC trending down with IV antibiotic therapy. Since patient is improving with antibiotic I do not anticipate any surgical management on this admission. If she continue to respond to antibiotic therapy will defer to GI service on the timing of the workup for inflammatory bowel disease. Agree with current management. Will continue to follow while inpatient.   Arnold Long, MD

## 2020-05-09 NOTE — Progress Notes (Signed)
PROGRESS NOTE    [name]    Code Status: Full Code  SWN:[id number] DOB: 18-Apr-1991 DOA: 05/08/2020 LOS: 1 days  PCP: [place], Betty G, MD CC:  Chief Complaint  Patient presents with  . Abdominal Pain       Hospital Summary   Carla Stephens is a 29 y.o. female with a history of anal fissure who has had a 13-monthhistory of intermittent colicky abdominal pain and associated with increased discomfort with p.o. intake which has been worsening over the past several days and was admitted for abnormal outpatient CT findings of extensive inflammatory changes consistent with enteritis and small infection abscesses.  ED Course:On arrival, she was tachycardic with low-grade temperature of 99.2 and otherwise normal vitals. Blood work significant for leukocytosis of 16,000. Lactic acid pending. Started on Zosyn and vancomycin which was initially changed to vancomycin/cefepime/Flagyl on admission. Both GI and General Surgery were consulted.   Antibiotics were changed to zosyn monotherapy.    A & P   Active Problems:   Enteritis   Intestinal abscess   1. SIRS secondary to enteritis with mesenteric abscess, inflammatory versus infectious  a. Tachycardia (resolved) and leukocytosis on presentation with intra-abdominal infection. Less likely sepsis b. SIRS resolved c. General surgery and GI consulted i. General surgery: fluid collection in the right ovary is likely an ovarian cyst and not a true abscess.  2 cm abscess next in the ileum is not amenable to aspirate or drainage.  Continue with IV antibiotics and work-up for possible inflammatory disease.  No surgical indication at this time ii. GI: Could be Crohn's disease.  Hold off on steroids at this time given infection.  Will need reimaging in a short period of time to check for interval changes.  MR enterography of the small bowel may be needed and would require an endoscopy in the future.  Follow-up in 7 to 10 days from  discharge with GI, serial abdominal exams and close follow up with Dr. AVicente Malesat discharge 4. Continue zosyn 5. Advance diet to full liquids  DVT prophylaxis: lovenox (if patient has Crohn's then she is at higher risk for DVT)   Family Communication: no family at bedside  Disposition Plan:  Status is: Inpatient  Remains inpatient appropriate because:IV treatments appropriate due to intensity of illness or inability to take PO and Inpatient level of care appropriate due to severity of illness   Dispo: The patient is from: Home              Anticipated d/c is to: Home              Anticipated d/c date is: 2 days              Patient currently is not medically stable to d/c.          Pressure injury documentation    None  Consultants  GI General surgery  Procedures  None  Antibiotics   Anti-infectives (From admission, onward)   Start     Dose/Rate Route Frequency Ordered Stop   05/08/20 1600  vancomycin (VANCOCIN) IVPB 1000 mg/200 mL premix  Status:  Discontinued        1,000 mg 200 mL/hr over 60 Minutes Intravenous Every 12 hours 05/08/20 0632 05/08/20 1338   05/08/20 1600  piperacillin-tazobactam (ZOSYN) IVPB 3.375 g        3.375 g 12.5 mL/hr over 240 Minutes Intravenous Every 8 hours 05/08/20 1342     05/08/20 1400  ceFEPIme (MAXIPIME) 2 g in sodium chloride 0.9 % 100 mL IVPB  Status:  Discontinued        2 g 200 mL/hr over 30 Minutes Intravenous Every 8 hours 05/08/20 0628 05/08/20 1338   05/08/20 1300  piperacillin-tazobactam (ZOSYN) IVPB 3.375 g  Status:  Discontinued        3.375 g 12.5 mL/hr over 240 Minutes Intravenous Every 8 hours 05/08/20 0514 05/08/20 0533   05/08/20 0600  ceFEPIme (MAXIPIME) 2 g in sodium chloride 0.9 % 100 mL IVPB        2 g 200 mL/hr over 30 Minutes Intravenous  Once 05/08/20 0532 05/08/20 0840   05/08/20 0600  metroNIDAZOLE (FLAGYL) IVPB 500 mg  Status:  Discontinued        500 mg 100 mL/hr over 60 Minutes Intravenous Every 8  hours 05/08/20 0532 05/08/20 1338   05/08/20 0430  piperacillin-tazobactam (ZOSYN) IVPB 3.375 g        3.375 g 100 mL/hr over 30 Minutes Intravenous  Once 05/08/20 0421 05/08/20 0539   05/08/20 0430  vancomycin (VANCOCIN) IVPB 1000 mg/200 mL premix        1,000 mg 200 mL/hr over 60 Minutes Intravenous  Once 05/08/20 0421 05/08/20 0753        Subjective   Patient seen and examined at bedside in no acute distress and resting comfortably. No acute events overnight. Denies any acute complaints at this time. Ambulating. Tolerating clear liquid diet  Objective   Vitals:   05/09/20 0525 05/09/20 0814 05/09/20 1141 05/09/20 1529  BP: 106/69 111/73 104/73 (!) 102/58  Pulse: 69 73 72 97  Resp: 18 16 16 16   Temp: 98.4 F (36.9 C) 98.4 F (36.9 C) 98 F (36.7 C) 98.4 F (36.9 C)  TempSrc: Oral Oral Oral Oral  SpO2: 98% 98% 98% 99%  Weight:      Height:        Intake/Output Summary (Last 24 hours) at 05/09/2020 1551 Last data filed at 05/09/2020 1420 Gross per 24 hour  Intake 1023.04 ml  Output --  Net 1023.04 ml   Filed Weights   05/07/20 1844  Weight: 67.1 kg    Examination:  Physical Exam Vitals and nursing note reviewed.  Constitutional:      Appearance: Normal appearance.  HENT:     Head: Normocephalic and atraumatic.  Eyes:     Conjunctiva/sclera: Conjunctivae normal.  Cardiovascular:     Rate and Rhythm: Normal rate and regular rhythm.  Pulmonary:     Effort: Pulmonary effort is normal.     Breath sounds: Normal breath sounds.  Abdominal:     General: Abdomen is flat.     Palpations: Abdomen is soft.  Musculoskeletal:        General: No swelling or tenderness.  Skin:    Coloration: Skin is not jaundiced or pale.  Neurological:     Mental Status: She is alert. Mental status is at baseline.  Psychiatric:        Mood and Affect: Mood normal.        Behavior: Behavior normal.     Data Reviewed: I have personally reviewed following labs and imaging  studies  CBC: Recent Labs  Lab 05/04/20 1512 05/07/20 2210 05/09/20 0553  WBC 13.9* 16.2* 12.0*  NEUTROABS 10,703*  --   --   HGB 12.7 12.8 11.9*  HCT 38.4 38.4 36.1  MCV 80.7 79.8* 80.0  PLT 411* 462* 409*   Basic Metabolic Panel: Recent Labs  Lab 05/04/20 1512 05/07/20 2210 05/09/20 0553  NA 137 136 141  K 3.8 3.6 3.8  CL 99 99 105  CO2 27 25 24   GLUCOSE 94 122* 81  BUN 9 9 7   CREATININE 0.71 0.58 0.63  CALCIUM 9.3 9.0 9.2   GFR: Estimated Creatinine Clearance: 97.8 mL/min (by C-G formula based on SCr of 0.63 mg/dL). Liver Function Tests: Recent Labs  Lab 05/04/20 1512 05/07/20 2210  AST 13 17  ALT 12 19  ALKPHOS  --  96  BILITOT 0.4 0.4  PROT 7.7 8.5*  ALBUMIN  --  3.8   Recent Labs  Lab 05/07/20 2210  LIPASE 25   No results for input(s): AMMONIA in the last 168 hours. Coagulation Profile: Recent Labs  Lab 05/08/20 0609  INR 1.0   Cardiac Enzymes: No results for input(s): CKTOTAL, CKMB, CKMBINDEX, TROPONINI in the last 168 hours. BNP (last 3 results) No results for input(s): PROBNP in the last 8760 hours. HbA1C: No results for input(s): HGBA1C in the last 72 hours. CBG: No results for input(s): GLUCAP in the last 168 hours. Lipid Profile: No results for input(s): CHOL, HDL, LDLCALC, TRIG, CHOLHDL, LDLDIRECT in the last 72 hours. Thyroid Function Tests: No results for input(s): TSH, T4TOTAL, FREET4, T3FREE, THYROIDAB in the last 72 hours. Anemia Panel: No results for input(s): VITAMINB12, FOLATE, FERRITIN, TIBC, IRON, RETICCTPCT in the last 72 hours. Sepsis Labs: Recent Labs  Lab 05/08/20 0530 05/08/20 0802  LATICACIDVEN 0.7 0.7    Recent Results (from the past 240 hour(s))  Urine Culture     Status: None   Collection Time: 05/04/20  3:44 PM   Specimen: Urine  Result Value Ref Range Status   MICRO NUMBER: [id number]  Final   SPECIMEN QUALITY: Adequate  Final   Sample Source NOT GIVEN  Final   STATUS: FINAL  Final   Result:   Final     Growth of mixed flora was isolated, suggesting probable contamination. No further testing will be performed. If clinically indicated, recollection using a method to minimize contamination, with prompt transfer to Urine Culture Transport Tube, is  recommended.   Urine culture     Status: None   Collection Time: 05/05/20  4:31 PM   Specimen: Urine  Result Value Ref Range Status   MICRO NUMBER: [id number]  Final   SPECIMEN QUALITY: Adequate  Final   Sample Source URINE  Final   STATUS: FINAL  Final   Result: No Growth  Final  SARS Coronavirus 2 by RT PCR (hospital order, performed in [facility] hospital lab) Nasopharyngeal Nasopharyngeal Swab     Status: None   Collection Time: 05/08/20  5:09 AM   Specimen: Nasopharyngeal Swab  Result Value Ref Range Status   SARS Coronavirus 2 NEGATIVE NEGATIVE Final    Comment: (NOTE) SARS-CoV-2 target nucleic acids are NOT DETECTED.  The SARS-CoV-2 RNA is generally detectable in upper and lower respiratory specimens during the acute phase of infection. The lowest concentration of SARS-CoV-2 viral copies this assay can detect is 250 copies / mL. A negative result does not preclude SARS-CoV-2 infection and should not be used as the sole basis for treatment or other patient management decisions.  A negative result may occur with improper specimen collection / handling, submission of specimen other than nasopharyngeal swab, presence of viral mutation(s) within the areas targeted by this assay, and inadequate number of viral copies (<250 copies / mL). A negative result must be combined with clinical observations, patient  history, and epidemiological information.  Fact Sheet for Patients:   StrictlyIdeas.no  Fact Sheet for Healthcare Providers: BankingDealers.co.za  This test is not yet approved or  cleared by the [place] FDA and has been authorized for detection and/or diagnosis of SARS-CoV-2  by FDA under an Emergency Use Authorization (EUA).  This EUA will remain in effect (meaning this test can be used) for the duration of the COVID-19 declaration under Section 564(b)(1) of the Act, 21 U.S.C. section 360bbb-3(b)(1), unless the authorization is terminated or revoked sooner.  Performed at [facility], [address] [place], [place] [address]   Culture, blood (x 2)     Status: None (Preliminary result)   Collection Time: 05/08/20  6:09 AM   Specimen: BLOOD  Result Value Ref Range Status   Specimen Description BLOOD BLOOD LEFT ARM  Final   Special Requests   Final    BOTTLES DRAWN AEROBIC AND ANAEROBIC Blood Culture adequate volume   Culture   Final    NO GROWTH 1 DAY Performed at [facility], [address] [place], [place] [address]    Report Status PENDING  Incomplete  Culture, blood (x 2)     Status: None (Preliminary result)   Collection Time: 05/08/20  6:09 AM   Specimen: BLOOD  Result Value Ref Range Status   Specimen Description BLOOD BLOOD LEFT HAND  Final   Special Requests   Final    BOTTLES DRAWN AEROBIC AND ANAEROBIC Blood Culture adequate volume   Culture   Final    NO GROWTH 1 DAY Performed at [facility], [address] [place], [place] [address]    Report Status PENDING  Incomplete         Radiology Studies: No results found.      Scheduled Meds: Continuous Infusions: . sodium chloride 100 mL/hr at 05/08/20 1625  . piperacillin-tazobactam (ZOSYN)  IV 3.375 g (05/09/20 1311)     Time spent: 20 minutes with over 50% of the time coordinating the patient's care    Harold Hedge, DO [facility] Pager [phone]  Call night coverage person covering after 7pm

## 2020-05-10 MED ORDER — AMOXICILLIN-POT CLAVULANATE 875-125 MG PO TABS: 1.0000 | ORAL_TABLET | Freq: Two times a day (BID) | ORAL | 0 refills | Status: AC

## 2020-05-10 NOTE — Discharge Summary (Signed)
Physician Discharge Summary  Jacoby Ritsema VFI:[id number] DOB: 1990-10-06   PCP: [place], Betty G, MD  Admit date: 05/08/2020 Discharge date: 05/10/2020 Length of Stay: 2 days   Code Status: Full Code  Admitted From:  Home Discharged to:   [place]:  None  Equipment/Devices:  None Discharge Condition:  Stable  Recommendations for Outpatient Follow-up   1. Follow up with GI in 1 week 2. Follow-up CBC 3. 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 intake which has been worsening over the past several days andwas admitted for abnormal outpatient CT findings of extensive inflammatory changes consistent with enteritis and small infection abscesses.  ED Course:On arrival, she was tachycardic with low-grade temperature of 99.2 and otherwise normal vitals. Blood work significant for leukocytosis of 16,000. Lactic acid pending. Started on Zosyn and vancomycinwhich was initially changed to vancomycin/cefepime/Flagyl on admission. Both GI and General Surgery were consulted.   Antibiotics were changed to zosyn monotherapy and patient had improvement in white count and symptoms and was able to tolerate advancement of diet to soft diet.  8/22: Discharged in stable condition with Augmentin twice daily to complete at least 7 days of antibiotic therapy.  She should follow-up with GI in the office within 7 days of discharge   A & P   Active Problems:   Enteritis   Intestinal abscess    1. SIRS secondary to enteritis with mesenteric abscess, inflammatory versus infectious   1. Tachycardia and leukocytosis on presentation with intra-abdominal infection.  2. SIRS resolved, Tolerating soft diet 3. General surgery and GI consulted 4. General surgery: fluid  collection in the right ovary is likely an ovarian cyst and not a true abscess. 2 cm abscess next in the ileum is not amenableto aspirate ordrainage. No surgical indication at this time 5. GI: Could be Crohn's disease. Hold off on steroids at this time given infection. Will need reimaging in a short period of time to check for interval changes. MR enterography of the small bowel may be needed and wouldrequireanendoscopy in the future. Follow-up in 7 to 10 days from discharge with GI, close follow up with Dr. AVicente Malesat discharge 6. Augmentin twice daily to complete at least 7 days of antibiotic therapy 7. CBC    Consultants  . GI . General Surgery  Procedures  . none  Antibiotics   Anti-infectives (From admission, onward)   Start     Dose/Rate Route Frequency Ordered Stop   05/10/20 0000  amoxicillin-clavulanate (AUGMENTIN) 875-125 MG tablet        1 tablet Oral 2 times daily 05/10/20 1407 05/15/20 2359   05/08/20 1600  vancomycin (VANCOCIN) IVPB 1000 mg/200 mL premix  Status:  Discontinued        1,000 mg 200 mL/hr over 60 Minutes Intravenous Every 12 hours 05/08/20 0632 05/08/20 1338   05/08/20 1600  piperacillin-tazobactam (ZOSYN) IVPB 3.375 g        3.375 g 12.5 mL/hr over 240 Minutes Intravenous Every 8 hours 05/08/20 1342     05/08/20 1400  ceFEPIme (MAXIPIME) 2 g in sodium chloride 0.9 % 100 mL IVPB  Status:  Discontinued        2 g 200 mL/hr over 30 Minutes Intravenous Every 8 hours 05/08/20 0628 05/08/20 1338   05/08/20 1300  piperacillin-tazobactam (ZOSYN) IVPB 3.375 g  Status:  Discontinued        3.375 g 12.5 mL/hr over 240 Minutes Intravenous Every 8 hours 05/08/20 0514 05/08/20 0533   05/08/20 0600  ceFEPIme (MAXIPIME) 2 g in sodium chloride 0.9 % 100 mL IVPB        2 g 200 mL/hr over 30 Minutes Intravenous  Once 05/08/20 0532 05/08/20 0840   05/08/20 0600  metroNIDAZOLE (FLAGYL) IVPB 500 mg  Status:  Discontinued        500 mg 100 mL/hr over 60 Minutes  Intravenous Every 8 hours 05/08/20 0532 05/08/20 1338   05/08/20 0430  piperacillin-tazobactam (ZOSYN) IVPB 3.375 g        3.375 g 100 mL/hr over 30 Minutes Intravenous  Once 05/08/20 0421 05/08/20 0539   05/08/20 0430  vancomycin (VANCOCIN) IVPB 1000 mg/200 mL premix        1,000 mg 200 mL/hr over 60 Minutes Intravenous  Once 05/08/20 0421 05/08/20 0753       Subjective  Patient seen and examined at bedside in no acute distress and resting comfortably. Husband present. No events overnight.  Tolerated advancement of diet to soft at lunch. Refused blood draw and lovenox this morning. In good spirits and anticipating discharge.   Denies any chest pain, shortness of breath, fever, nausea, vomiting. Otherwise ROS negative    Objective   Discharge Exam: Vitals:   05/10/20 0408 05/10/20 0836  BP: 114/61 120/78  Pulse: 91 84  Resp: 16 16  Temp: 98.5 F (36.9 C) 98.4 F (36.9 C)  SpO2: 97% 100%   Vitals:   05/09/20 2002 05/09/20 2352 05/10/20 0408 05/10/20 0836  BP: 102/71 (!) 97/54 114/61 120/78  Pulse: 85 84 91 84  Resp: 16 17 16 16   Temp: 98.1 F (36.7 C) 98.3 F (36.8 C) 98.5 F (36.9 C) 98.4 F (36.9 C)  TempSrc: Oral Oral Oral Oral  SpO2: 98% 98% 97% 100%  Weight:      Height:        Physical Exam Vitals and nursing note reviewed.  Constitutional:      Appearance: Normal appearance.  HENT:     Head: Normocephalic and atraumatic.  Eyes:     Conjunctiva/sclera: Conjunctivae normal.  Cardiovascular:     Rate and Rhythm: Normal rate and regular rhythm.  Pulmonary:     Effort: Pulmonary effort is normal.     Breath sounds: Normal breath sounds.  Abdominal:     General: Abdomen is flat.     Palpations: Abdomen is soft.     Tenderness: There is no abdominal tenderness.  Musculoskeletal:        General: No swelling or tenderness.  Skin:    Coloration: Skin is not jaundiced or pale.  Neurological:     Mental Status: She is alert. Mental status is at baseline.   Psychiatric:        Mood and Affect: Mood normal.        Behavior: Behavior normal.       The results of significant diagnostics from this hospitalization (including imaging, microbiology, ancillary and laboratory) are listed below for reference.     Microbiology: Recent Results (from the past 240 hour(s))  Urine Culture     Status: None   Collection Time: 05/04/20  3:44 PM   Specimen: Urine  Result Value Ref Range Status   MICRO NUMBER: [id number]  Final   SPECIMEN QUALITY: Adequate  Final   Sample Source NOT GIVEN  Final   STATUS: FINAL  Final   Result:   Final    Growth of mixed flora was isolated, suggesting probable contamination. No further testing will be performed. If clinically indicated, recollection using a method to minimize contamination, with prompt transfer to Urine Culture Transport Tube, is  recommended.   Urine culture     Status: None   Collection Time: 05/05/20  4:31 PM   Specimen: Urine  Result Value Ref Range Status   MICRO NUMBER: [id number]  Final   SPECIMEN QUALITY: Adequate  Final   Sample Source URINE  Final   STATUS: FINAL  Final   Result: No Growth  Final  SARS Coronavirus 2 by RT PCR (hospital order, performed in [place] hospital lab) Nasopharyngeal Nasopharyngeal Swab     Status: None   Collection Time: 05/08/20  5:09 AM   Specimen: Nasopharyngeal Swab  Result Value Ref Range Status   SARS Coronavirus 2 NEGATIVE NEGATIVE Final    Comment: (NOTE) SARS-CoV-2 target nucleic acids are NOT DETECTED.  The SARS-CoV-2 RNA is generally detectable in upper and lower respiratory specimens during the acute phase of infection. The lowest concentration of SARS-CoV-2 viral copies this assay can detect is 250 copies / mL. A negative result does not preclude SARS-CoV-2 infection and should not be used as the sole basis for treatment or other patient management decisions.  A negative result may occur with improper specimen collection / handling,  submission of specimen other than nasopharyngeal swab, presence of viral mutation(s) within the areas targeted by this assay, and inadequate number of viral copies (<250 copies / mL). A negative result must be combined with clinical observations, patient history, and epidemiological information.  Fact Sheet for Patients:   StrictlyIdeas.no  Fact Sheet for Healthcare Providers: BankingDealers.co.za  This test is not yet approved or  cleared by the [place] FDA and has been authorized for detection and/or diagnosis of SARS-CoV-2 by FDA under an Emergency Use Authorization (EUA).  This EUA will remain in effect (meaning this test can be used) for the duration of the COVID-19 declaration under Section 564(b)(1) of the Act, 21 U.S.C. section 360bbb-3(b)(1), unless the authorization is terminated or revoked sooner.  Performed at [facility], [address] [place], [place] [address]   Culture, blood (x 2)     Status: None (Preliminary result)   Collection Time: 05/08/20  6:09 AM   Specimen: BLOOD  Result Value Ref Range Status   Specimen Description BLOOD BLOOD LEFT ARM  Final   Special Requests   Final    BOTTLES DRAWN AEROBIC AND ANAEROBIC Blood Culture adequate volume   Culture   Final    NO GROWTH 2 DAYS Performed at [facility], [address] [place], [place] [address]    Report Status PENDING  Incomplete  Culture, blood (x 2)     Status: None (Preliminary result)   Collection Time: 05/08/20  6:09 AM   Specimen: BLOOD  Result Value Ref Range Status   Specimen Description BLOOD BLOOD LEFT HAND  Final   Special Requests   Final    BOTTLES DRAWN AEROBIC AND ANAEROBIC Blood Culture adequate volume   Culture   Final    NO GROWTH 2 DAYS Performed at [facility], [address], [place], [place] [address]    Report Status PENDING  Incomplete     Labs: BNP (last 3 results) No results for  input(s): BNP in the last 8760 hours. Basic Metabolic Panel: Recent Labs  Lab 05/04/20 1512  05/07/20 2210 05/09/20 0553  NA 137 136 141  K 3.8 3.6 3.8  CL 99 99 105  CO2 27 25 24   GLUCOSE 94 122* 81  BUN 9 9 7   CREATININE 0.71 0.58 0.63  CALCIUM 9.3 9.0 9.2   Liver Function Tests: Recent Labs  Lab 05/04/20 1512 05/07/20 2210  AST 13 17  ALT 12 19  ALKPHOS  --  96  BILITOT 0.4 0.4  PROT 7.7 8.5*  ALBUMIN  --  3.8   Recent Labs  Lab 05/07/20 2210  LIPASE 25   No results for input(s): AMMONIA in the last 168 hours. CBC: Recent Labs  Lab 05/04/20 1512 05/07/20 2210 05/09/20 0553  WBC 13.9* 16.2* 12.0*  NEUTROABS 10,703*  --   --   HGB 12.7 12.8 11.9*  HCT 38.4 38.4 36.1  MCV 80.7 79.8* 80.0  PLT 411* 462* 438*   Cardiac Enzymes: No results for input(s): CKTOTAL, CKMB, CKMBINDEX, TROPONINI in the last 168 hours. BNP: Invalid input(s): POCBNP CBG: No results for input(s): GLUCAP in the last 168 hours. D-Dimer No results for input(s): DDIMER in the last 72 hours. Hgb A1c No results for input(s): HGBA1C in the last 72 hours. Lipid Profile No results for input(s): CHOL, HDL, LDLCALC, TRIG, CHOLHDL, LDLDIRECT in the last 72 hours. Thyroid function studies No results for input(s): TSH, T4TOTAL, T3FREE, THYROIDAB in the last 72 hours.  Invalid input(s): FREET3 Anemia work up No results for input(s): VITAMINB12, FOLATE, FERRITIN, TIBC, IRON, RETICCTPCT in the last 72 hours. Urinalysis    Component Value Date/Time   COLORURINE YELLOW (A) 05/08/2020 0700   APPEARANCEUR HAZY (A) 05/08/2020 0700   LABSPEC 1.016 05/08/2020 0700   PHURINE 5.0 05/08/2020 0700   GLUCOSEU NEGATIVE 05/08/2020 0700   GLUCOSEU NEGATIVE 05/06/2020 1523   HGBUR SMALL (A) 05/08/2020 0700   BILIRUBINUR NEGATIVE 05/08/2020 0700   BILIRUBINUR Positive 05/04/2020 1536   KETONESUR 5 (A) 05/08/2020 0700   PROTEINUR NEGATIVE 05/08/2020 0700   UROBILINOGEN 1.0 05/06/2020 1523   NITRITE  NEGATIVE 05/08/2020 0700   LEUKOCYTESUR SMALL (A) 05/08/2020 0700   Sepsis Labs Invalid input(s): PROCALCITONIN,  WBC,  LACTICIDVEN Microbiology Recent Results (from the past 240 hour(s))  Urine Culture     Status: None   Collection Time: 05/04/20  3:44 PM   Specimen: Urine  Result Value Ref Range Status   MICRO NUMBER: [id number]  Final   SPECIMEN QUALITY: Adequate  Final   Sample Source NOT GIVEN  Final   STATUS: FINAL  Final   Result:   Final    Growth of mixed flora was isolated, suggesting probable contamination. No further testing will be performed. If clinically indicated, recollection using a method to minimize contamination, with prompt transfer to Urine Culture Transport Tube, is  recommended.   Urine culture     Status: None   Collection Time: 05/05/20  4:31 PM   Specimen: Urine  Result Value Ref Range Status   MICRO NUMBER: [id number]  Final   SPECIMEN QUALITY: Adequate  Final   Sample Source URINE  Final   STATUS: FINAL  Final   Result: No Growth  Final  SARS Coronavirus 2 by RT PCR (hospital order, performed in [place] hospital lab) Nasopharyngeal Nasopharyngeal Swab     Status: None   Collection Time: 05/08/20  5:09 AM   Specimen: Nasopharyngeal Swab  Result Value Ref Range Status   SARS Coronavirus 2 NEGATIVE NEGATIVE Final    Comment: (NOTE) SARS-CoV-2 target nucleic  acids are NOT DETECTED.  The SARS-CoV-2 RNA is generally detectable in upper and lower respiratory specimens during the acute phase of infection. The lowest concentration of SARS-CoV-2 viral copies this assay can detect is 250 copies / mL. A negative result does not preclude SARS-CoV-2 infection and should not be used as the sole basis for treatment or other patient management decisions.  A negative result may occur with improper specimen collection / handling, submission of specimen other than nasopharyngeal swab, presence of viral mutation(s) within the areas targeted by this assay, and  inadequate number of viral copies (<250 copies / mL). A negative result must be combined with clinical observations, patient history, and epidemiological information.  Fact Sheet for Patients:   StrictlyIdeas.no  Fact Sheet for Healthcare Providers: BankingDealers.co.za  This test is not yet approved or  cleared by the [place] FDA and has been authorized for detection and/or diagnosis of SARS-CoV-2 by FDA under an Emergency Use Authorization (EUA).  This EUA will remain in effect (meaning this test can be used) for the duration of the COVID-19 declaration under Section 564(b)(1) of the Act, 21 U.S.C. section 360bbb-3(b)(1), unless the authorization is terminated or revoked sooner.  Performed at [facility], [address] [place], [place] [address]   Culture, blood (x 2)     Status: None (Preliminary result)   Collection Time: 05/08/20  6:09 AM   Specimen: BLOOD  Result Value Ref Range Status   Specimen Description BLOOD BLOOD LEFT ARM  Final   Special Requests   Final    BOTTLES DRAWN AEROBIC AND ANAEROBIC Blood Culture adequate volume   Culture   Final    NO GROWTH 2 DAYS Performed at [facility], [address], [place], [place] [address]    Report Status PENDING  Incomplete  Culture, blood (x 2)     Status: None (Preliminary result)   Collection Time: 05/08/20  6:09 AM   Specimen: BLOOD  Result Value Ref Range Status   Specimen Description BLOOD BLOOD LEFT HAND  Final   Special Requests   Final    BOTTLES DRAWN AEROBIC AND ANAEROBIC Blood Culture adequate volume   Culture   Final    NO GROWTH 2 DAYS Performed at [facility], [address], [place], [place] [address]    Report Status PENDING  Incomplete    Discharge Instructions     Discharge Instructions    Diet - low sodium heart healthy   Complete by: As directed    Discharge instructions   Complete by: As directed    -  Take Augmentin twice daily for the next 5 days, your first dose will be this evening - Follow up with GI in one week If you have any significant change or worsening of your symptoms, do not hesitate to contact your primary care physician or return to the ED.   Increase activity slowly   Complete by: As directed      Allergies as of 05/10/2020   No Known Allergies     Medication List    TAKE these medications   amoxicillin-clavulanate 875-125 MG tablet Commonly known as: Augmentin Take 1 tablet by mouth 2 (two) times daily for 5 days.       No Known Allergies   Dispo: The patient is from: Home              Anticipated d/c is to: Home  Anticipated d/c date is: today              Patient currently is medically stable to d/c.       Time coordinating discharge: Over 30 minutes   SIGNED:   Harold Hedge, D.O. [facility] Pager: [phone]  05/10/2020, 2:11 PM

## 2020-05-10 NOTE — Progress Notes (Signed)
Received pt with stable VS, independent. Did complain of some abd pain this morning  And gassy stomach but was not needing any pain med. Diet was advanced to soft diet and was able to tolerate the diet. Pt is for DC after the last dose of Zosyn.

## 2020-05-11 ENCOUNTER — Encounter: Payer: Self-pay | Admitting: Family Medicine

## 2020-05-11 ENCOUNTER — Ambulatory Visit: Payer: BC Managed Care – PPO | Admitting: Family Medicine

## 2020-05-11 ENCOUNTER — Other Ambulatory Visit: Payer: Self-pay

## 2020-05-11 VITALS — BP 122/70 | HR 96 | Resp 12 | Ht 64.0 in | Wt 146.0 lb

## 2020-05-11 DIAGNOSIS — K630 Abscess of intestine: Secondary | ICD-10-CM | POA: Diagnosis not present

## 2020-05-11 NOTE — Patient Instructions (Addendum)
A few things to remember from today's visit:   No changes today. We will try to get an appt with GI sooner.   Crohn's Disease  Crohn's disease is a long-lasting (chronic) disease that affects the gastrointestinal (GI) tract. Crohn's disease often causes irritation and inflammation in the small intestine and the beginning of the large intestine, but it can affect any part of the GI tract. Crohn's disease is part of a group of illnesses that are known as inflammatory bowel disease (IBD). Crohn's disease may start slowly and get worse over time. Symptoms may come and go. They may also go away for months or even years at a time (remission). What are the causes? The exact cause of this condition is not known. It may involve a response that causes your body's disease-fighting (immune) system to attack healthy cells and tissues (autoimmune response). Bacteria, genes, and your environment may also play a role. What increases the risk? The following factors may make you more likely to develop this condition:  Having a family member who has Crohn's disease, another IBD, or an autoimmune condition.  Using products that contain nicotine or tobacco, such as cigarettes and e-cigarettes.  Being in your 55s.  Having [place] European ancestry. What are the signs or symptoms? The main symptoms of this condition involve your GI tract. These include:  Diarrhea.  Pain or cramping in the abdomen. This is commonly felt in the lower right side of the abdomen.  Frequent watery or bloody stools.  Constipation. This may mean having: ? Fewer bowel movements in a week than normal. ? Difficulty having a bowel movement. ? Stools that are dry, hard, or larger than normal.  Rectal bleeding.  Rectal pain.  An urgent need to have a bowel movement.  The feeling that you are not finished having a bowel movement. Other symptoms may include:  Unexplained weight loss.  Fatigue.  Fever.  Nausea.  Loss  of appetite.  Joint pain.  Vision changes.  Red bumps or sores on the skin.  Sores inside the mouth. How is this diagnosed? This condition may be diagnosed based on:  Your symptoms and your medical history.  A physical exam.  Tests, which may include: ? Blood tests. ? Stool sample tests. ? Imaging tests, such as X-rays and CT scans. ? Tests to examine the inside of your intestines using a long, flexible tube that has a light and a camera on the end (endoscopy or colonoscopy). ? A procedure to remove tissue samples from inside your bowel for testing (biopsy). You may need to work with a health care provider who specializes in diseases of the digestive tract (gastroenterologist). How is this treated? There is no cure for this condition, but treatment can help you manage your symptoms. Crohn's disease affects each person differently. Your treatment may include:  Resting your bowels. This involves having a period of healing time when your bowels are not passing stools. This may be done by: ? Drinking only clear liquids. These are liquids that you can see through, such as water, black coffee, fruit juice without pulp, broth, gelatin, and ice pops. ? Getting nutrition through an IV for a period of time.  Medicines. These may be used by themselves or with other treatments (combination therapy). These may include antibiotic medicines. You may be given medicines that help to: ? Reduce inflammation. ? Control your immune system activity. ? Fight infections. ? Relieve cramps and prevent diarrhea. ? Control your pain.  Surgery. You may  need surgery if: ? Medicines and other treatments are not working anymore. ? You develop complications from severe Crohn's disease. ? A section of your intestine becomes so damaged that it needs to be removed. Follow these instructions at home: Medicines  Take over-the-counter and prescription medicines only as told by your health care provider.  If  you were prescribed an antibiotic, take it as told by your health care provider. Do not stop taking the antibiotic even if you start to feel better.  Avoid taking ibuprofen or other NSAID medicines if possible, these can make Crohn's disease worse. Eating and drinking  Talk with your health care provider or a diet and nutrition specialist (registered dietitian) about what diet is best for you.  Drink enough fluid to keep your urine pale yellow.  If you are taking steroids to reduce inflammation, get plenty of calcium in your diet to help keep your bones healthy. You may also consider taking a calcium supplement with vitamin D.  Keep a food diary to identify foods that make your symptoms better or worse.  Avoid foods that cause symptoms.  Follow instructions from your health care provider about eating or drinking restrictions if you have worsening symptoms (flare-up).  Limit alcohol intake to no more than 1 drink a day for nonpregnant women and 2 drinks a day for men. One drink equals 12 oz of beer, 5 oz of wine, or 1 oz of hard liquor. General instructions  Make sure you get all the vaccines that your health care provider recommends, especially pneumonia (pneumococcal) and flu (influenza) vaccines.  Do not use any products that contain nicotine or tobacco, such as cigarettes and e-cigarettes. If you need help quitting, ask your health care provider.  Exercise every day, or as often told by your health care provider.  Keep all follow-up visits as told by your health care provider. This is important. Contact a health care provider if:  You have diarrhea, cramps in your abdomen, and other GI problems that are present almost all the time.  Your symptoms do not improve with treatment.  You continue to lose weight.  You develop a rash or sores on your skin.  You develop eye problems.  You have a fever.  Your symptoms get worse.  You develop new symptoms. Get help right away  if:  You have bloody diarrhea.  You have severe pain in your abdomen.  You cannot pass stools. Summary  Crohn's disease affects each person differently. There are multiple treatment options that can help you manage the condition.  Talk with your health care provider or diet and nutrition specialist (registered dietitian) about what diet is best for you.  Make sure you get all the vaccines that your health care provider recommends, especially pneumonia (pneumococcal) and flu (influenza) vaccines. This information is not intended to replace advice given to you by your health care provider. Make sure you discuss any questions you have with your health care provider. Document Revised: 08/18/2017 Document Reviewed: 05/08/2017 Elsevier Patient Education  [employer].  If you need refills please call your pharmacy. Do not use My Chart to request refills or for acute issues that need immediate attention.    Please be sure medication list is accurate. If a new problem present, please set up appointment sooner than planned today.

## 2020-05-11 NOTE — Progress Notes (Signed)
HPI: [name] is a 29 y.o. female, who is here today to follow on recent OV and recent hospitalization. She was seen here in the office for acute visit, 05/04/20, for abdominal pain that has been intermittent for 2 months.  Lab Results  Component Value Date   CRP 110.7 (H) 05/04/2020  Abdominal CT on 05/07/20: Extensive bowel wall thickening of the terminal ileum extending into ileocecal valve with extensive surrounding inflammatory changes consistent with enteritis, question Crohn's disease, less likely infectious enteritis or foreign body perforation.  Associated free fluid and mesenteric stranding with a small abscess collection within the mesentery 2.2 cm greatest size and a second focal fluid collection in the RIGHT adnexa question second abscess 3.2 cm diameter versus ovarian cyst. No free air identified.  She was sent to the ER and she was admitted on 05/08/20 and discharged on 05/10/20.  Lab Results  Component Value Date   WBC 12.0 (H) 05/09/2020   HGB 11.9 (L) 05/09/2020   HCT 36.1 05/09/2020   MCV 80.0 05/09/2020   PLT 438 (H) 05/09/2020   Lab Results  Component Value Date   CREATININE 0.63 05/09/2020   BUN 7 05/09/2020   NA 141 05/09/2020   K 3.8 05/09/2020   CL 105 05/09/2020   CO2 24 05/09/2020   She was treated with IV abx, Zosyn and Vancomycin initially and changed to vancomycin/cefepine/metronidazol during hospitalization. She was discharged on Augmentin 875-125  Mg bid x 5 days.  She was instructed to follow with GI in a week but the earliest one she got was for 06/09/20.  She has not had fever, chills,blood in stool,or melena. Urinary frequency, which she attributes to IV hydration. Negative for dysuria, gross hematuria,or urgency.  Mild abdominal pain, improving. She has good appetite.  BCx x 2 no growth x 3 days.  Review of Systems  Constitutional: Positive for fatigue. Negative for activity change and fever.  HENT: Negative for  mouth sores, nosebleeds and trouble swallowing.   Respiratory: Negative for cough, shortness of breath and wheezing.   Cardiovascular: Negative for chest pain, palpitations and leg swelling.  Gastrointestinal: Negative for nausea and vomiting.       Negative for changes in bowel habits.  Musculoskeletal: Positive for back pain (Mild bilateral, no radiated.). Negative for myalgias.  Neurological: Negative for syncope and weakness.  Rest see pertinent positives and negatives per HPI.  Current Outpatient Medications on File Prior to Visit  Medication Sig Dispense Refill  . amoxicillin-clavulanate (AUGMENTIN) 875-125 MG tablet Take 1 tablet by mouth 2 (two) times daily for 5 days. 10 tablet 0   No current facility-administered medications on file prior to visit.    Past Medical History:  Diagnosis Date  . 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 Marital status: Married    Spouse name: Not on file  . Number of children: Not on file  . Years of education: Not on file  . Highest education level: Not on file  Occupational History  . Not on file  Tobacco Use  . Smoking status: Never Smoker  . Smokeless tobacco: Never Used  Vaping Use  . Vaping Use: Never used  Substance and Sexual Activity  . Alcohol use: No    Alcohol/week: 0.0 standard drinks  . Drug use: No  . Sexual activity: Yes  Other Topics Concern  . Not on  file  Social History Narrative  . Not on file   Social Determinants of Health   Financial Resource Strain:   . Difficulty of Paying Living Expenses: Not on file  Food Insecurity:   . Worried About Charity fundraiser in the Last Year: Not on file  . Ran Out of Food in the Last Year: Not on file  Transportation Needs:   . Lack of Transportation (Medical): Not on file  . Lack of Transportation (Non-Medical): Not on file  Physical Activity:   . Days of Exercise per Week: Not on  file  . Minutes of Exercise per Session: Not on file  Stress:   . Feeling of Stress : Not on file  Social Connections:   . Frequency of Communication with Friends and Family: Not on file  . Frequency of Social Gatherings with Friends and Family: Not on file  . Attends Religious Services: Not on file  . Active Member of Clubs or Organizations: Not on file  . Attends Archivist Meetings: Not on file  . Marital Status: Not on file    Vitals:   05/11/20 1617  BP: 122/70  Pulse: 96  Resp: 12  SpO2: 97%   Body mass index is 25.06 kg/m.  Physical Exam Constitutional:      General: She is not in acute distress.    Appearance: She is well-developed and normal weight. She is not ill-appearing.  HENT:     Head: Normocephalic and atraumatic.     Mouth/Throat:     Mouth: Mucous membranes are moist.     Pharynx: Oropharynx is clear.  Eyes:     General: No scleral icterus.    Conjunctiva/sclera: Conjunctivae normal.  Cardiovascular:     Rate and Rhythm: Normal rate and regular rhythm.     Heart sounds: No murmur heard.   Pulmonary:     Effort: Pulmonary effort is normal. No respiratory distress.     Breath sounds: Normal breath sounds.  Abdominal:     General: Bowel sounds are normal. There is no distension.     Palpations: Abdomen is soft. There is no hepatomegaly or mass.     Tenderness: There is abdominal tenderness in the left lower quadrant. There is no guarding or rebound.  Lymphadenopathy:     Cervical: No cervical adenopathy.     Upper Body:     Right upper body: No supraclavicular adenopathy.     Left upper body: No supraclavicular adenopathy.  Skin:    General: Skin is warm.     Findings: No erythema or rash.  Neurological:     General: No focal deficit present.     Mental Status: She is alert and oriented to person, place, and time.  Psychiatric:     Comments: Well groomed, good eye contact.    ASSESSMENT AND PLAN:  Carla Stephens was seen today for  follow-up.  Diagnoses and all orders for this visit:  Intestinal abscess  Symptoms have improved. Continue advancing diet as tolerated. Complete abx treatment.  She prefers not to have blood work today.  Monitor for worsening or new symptoms. We will try to move appt with GI. Instructed about warning signs.  Return if symptoms worsen or fail to improve.   Marzetta Lanza G. [place], MD  [facility]. [place] office.   A few things to remember from today's visit:   No changes today. We will try to get an appt with GI sooner.   Crohn's Disease  Crohn's  disease is a long-lasting (chronic) disease that affects the gastrointestinal (GI) tract. Crohn's disease often causes irritation and inflammation in the small intestine and the beginning of the large intestine, but it can affect any part of the GI tract. Crohn's disease is part of a group of illnesses that are known as inflammatory bowel disease (IBD). Crohn's disease may start slowly and get worse over time. Symptoms may come and go. They may also go away for months or even years at a time (remission). What are the causes? The exact cause of this condition is not known. It may involve a response that causes your body's disease-fighting (immune) system to attack healthy cells and tissues (autoimmune response). Bacteria, genes, and your environment may also play a role. What increases the risk? The following factors may make you more likely to develop this condition:  Having a family member who has Crohn's disease, another IBD, or an autoimmune condition.  Using products that contain nicotine or tobacco, such as cigarettes and e-cigarettes.  Being in your 74s.  Having [place] European ancestry. What are the signs or symptoms? The main symptoms of this condition involve your GI tract. These include:  Diarrhea.  Pain or cramping in the abdomen. This is commonly felt in the lower right side of the abdomen.  Frequent  watery or bloody stools.  Constipation. This may mean having: ? Fewer bowel movements in a week than normal. ? Difficulty having a bowel movement. ? Stools that are dry, hard, or larger than normal.  Rectal bleeding.  Rectal pain.  An urgent need to have a bowel movement.  The feeling that you are not finished having a bowel movement. Other symptoms may include:  Unexplained weight loss.  Fatigue.  Fever.  Nausea.  Loss of appetite.  Joint pain.  Vision changes.  Red bumps or sores on the skin.  Sores inside the mouth. How is this diagnosed? This condition may be diagnosed based on:  Your symptoms and your medical history.  A physical exam.  Tests, which may include: ? Blood tests. ? Stool sample tests. ? Imaging tests, such as X-rays and CT scans. ? Tests to examine the inside of your intestines using a long, flexible tube that has a light and a camera on the end (endoscopy or colonoscopy). ? A procedure to remove tissue samples from inside your bowel for testing (biopsy). You may need to work with a health care provider who specializes in diseases of the digestive tract (gastroenterologist). How is this treated? There is no cure for this condition, but treatment can help you manage your symptoms. Crohn's disease affects each person differently. Your treatment may include:  Resting your bowels. This involves having a period of healing time when your bowels are not passing stools. This may be done by: ? Drinking only clear liquids. These are liquids that you can see through, such as water, black coffee, fruit juice without pulp, broth, gelatin, and ice pops. ? Getting nutrition through an IV for a period of time.  Medicines. These may be used by themselves or with other treatments (combination therapy). These may include antibiotic medicines. You may be given medicines that help to: ? Reduce inflammation. ? Control your immune system activity. ? Fight  infections. ? Relieve cramps and prevent diarrhea. ? Control your pain.  Surgery. You may need surgery if: ? Medicines and other treatments are not working anymore. ? You develop complications from severe Crohn's disease. ? A section of your intestine becomes so  damaged that it needs to be removed. Follow these instructions at home: Medicines  Take over-the-counter and prescription medicines only as told by your health care provider.  If you were prescribed an antibiotic, take it as told by your health care provider. Do not stop taking the antibiotic even if you start to feel better.  Avoid taking ibuprofen or other NSAID medicines if possible, these can make Crohn's disease worse. Eating and drinking  Talk with your health care provider or a diet and nutrition specialist (registered dietitian) about what diet is best for you.  Drink enough fluid to keep your urine pale yellow.  If you are taking steroids to reduce inflammation, get plenty of calcium in your diet to help keep your bones healthy. You may also consider taking a calcium supplement with vitamin D.  Keep a food diary to identify foods that make your symptoms better or worse.  Avoid foods that cause symptoms.  Follow instructions from your health care provider about eating or drinking restrictions if you have worsening symptoms (flare-up).  Limit alcohol intake to no more than 1 drink a day for nonpregnant women and 2 drinks a day for men. One drink equals 12 oz of beer, 5 oz of wine, or 1 oz of hard liquor. General instructions  Make sure you get all the vaccines that your health care provider recommends, especially pneumonia (pneumococcal) and flu (influenza) vaccines.  Do not use any products that contain nicotine or tobacco, such as cigarettes and e-cigarettes. If you need help quitting, ask your health care provider.  Exercise every day, or as often told by your health care provider.  Keep all follow-up visits  as told by your health care provider. This is important. Contact a health care provider if:  You have diarrhea, cramps in your abdomen, and other GI problems that are present almost all the time.  Your symptoms do not improve with treatment.  You continue to lose weight.  You develop a rash or sores on your skin.  You develop eye problems.  You have a fever.  Your symptoms get worse.  You develop new symptoms. Get help right away if:  You have bloody diarrhea.  You have severe pain in your abdomen.  You cannot pass stools. Summary  Crohn's disease affects each person differently. There are multiple treatment options that can help you manage the condition.  Talk with your health care provider or diet and nutrition specialist (registered dietitian) about what diet is best for you.  Make sure you get all the vaccines that your health care provider recommends, especially pneumonia (pneumococcal) and flu (influenza) vaccines. This information is not intended to replace advice given to you by your health care provider. Make sure you discuss any questions you have with your health care provider. Document Revised: 08/18/2017 Document Reviewed: 05/08/2017 Elsevier Patient Education  [employer].  If you need refills please call your pharmacy. Do not use My Chart to request refills or for acute issues that need immediate attention.    Please be sure medication list is accurate. If a new problem present, please set up appointment sooner than planned today.

## 2020-05-13 LAB — CULTURE, BLOOD (ROUTINE X 2)
Culture: NO GROWTH
Culture: NO GROWTH
Special Requests: ADEQUATE
Special Requests: ADEQUATE

## 2020-06-09 ENCOUNTER — Ambulatory Visit: Payer: BC Managed Care – PPO | Admitting: Physician Assistant

## 2021-03-09 IMAGING — CT CT ABD-PELV W/ CM
1 of 2 series · 13 of 32 positions shown, 18 images · IV contrast (APPLIED)
Comparison: None

CLINICAL DATA: Abdominal infection/abscess suspected, nausea and
non intractable vomiting, cramping after eating since [REDACTED],
leukocytosis, pyuria

EXAM:
CT ABDOMEN AND PELVIS WITH CONTRAST
TECHNIQUE: Multidetector CT imaging of the abdomen and pelvis was performed
using the standard protocol following bolus administration of
intravenous contrast. Sagittal and coronal MPR images reconstructed
from axial data set.
CONTRAST:  100mL 89E37D-SHH IOPAMIDOL (89E37D-SHH) INJECTION 61% IV.
No oral contrast.

[Series 2: abd/pelvis w/cm · axial · 0.66mm/px · z∈[-436,-36]mm · 13 of 90 slices shown, 18 images]
[im 5/90  soft-tissue]
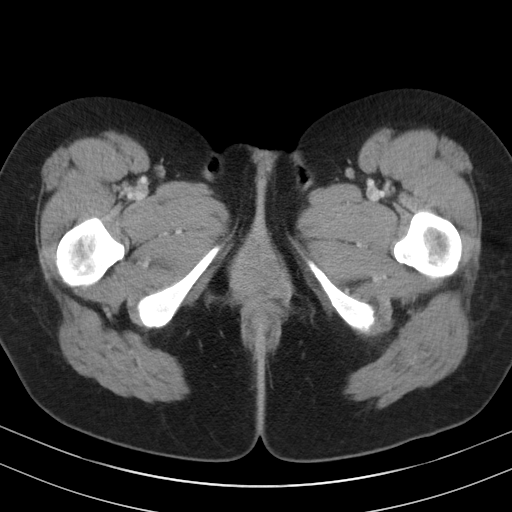
[im 5/90  bone]
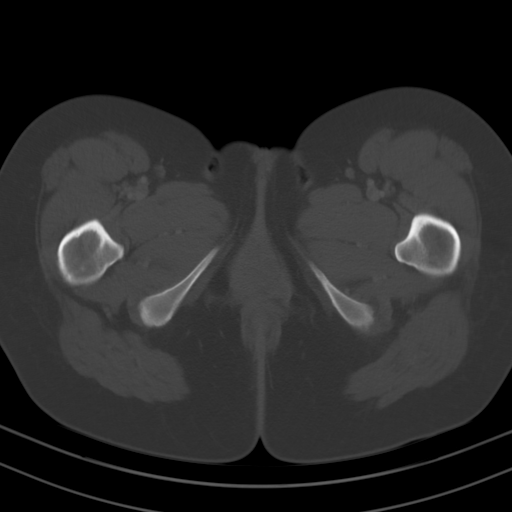
[im 14/90  soft-tissue]
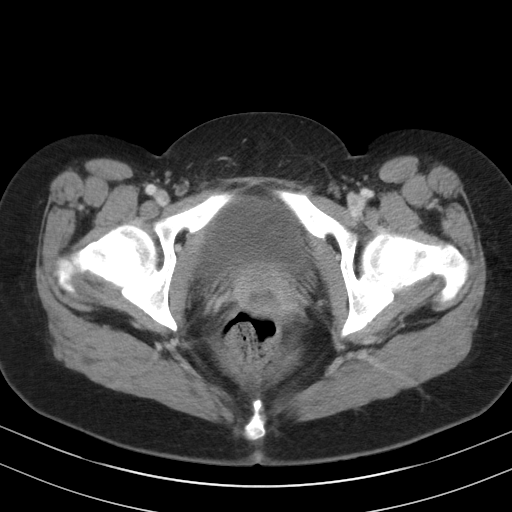
[im 18/90  soft-tissue]
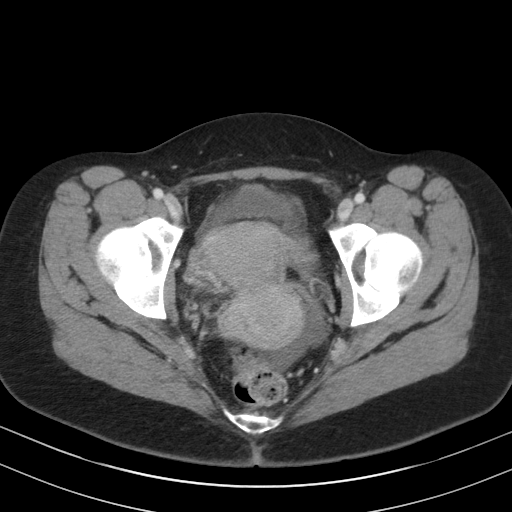
[im 27/90  soft-tissue]
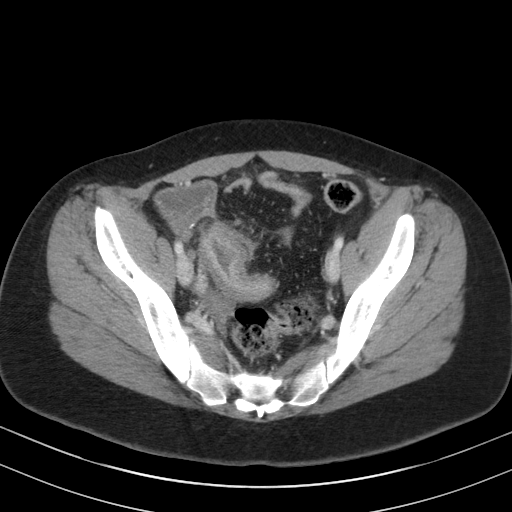
[im 36/90  soft-tissue]
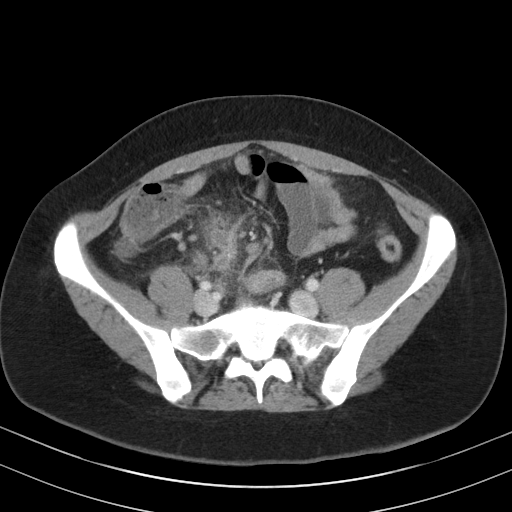
[im 41/90  soft-tissue]
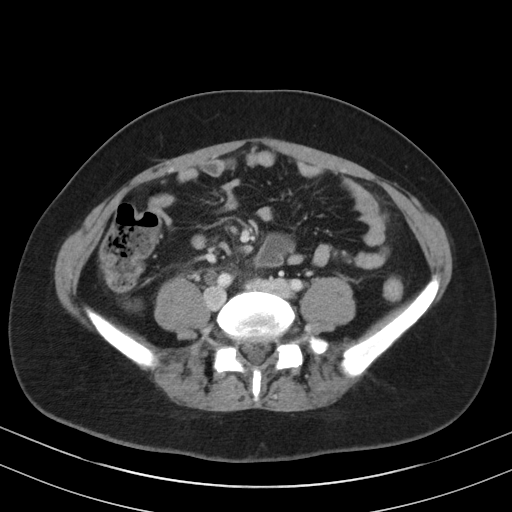
[im 49/90  soft-tissue]
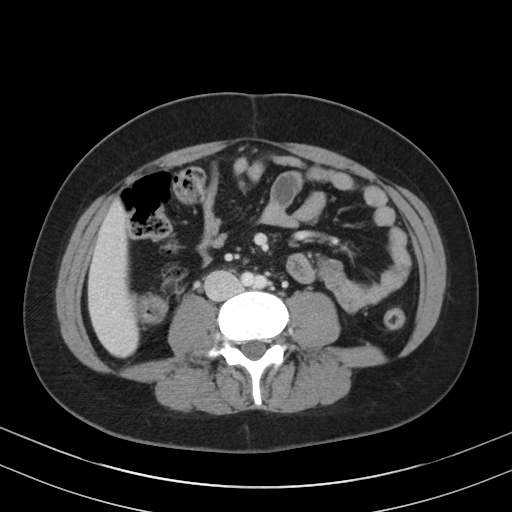
[im 54/90  soft-tissue]
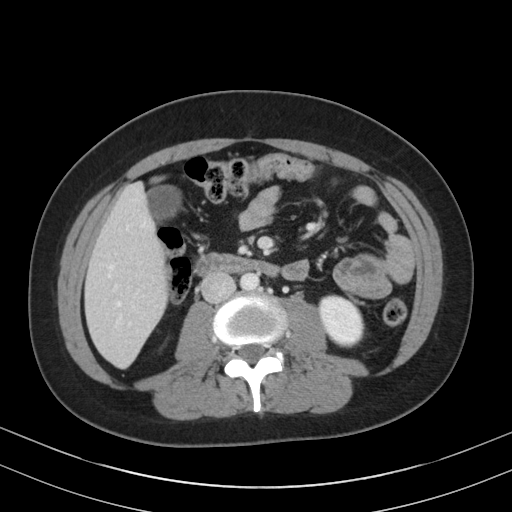
[im 63/90  soft-tissue]
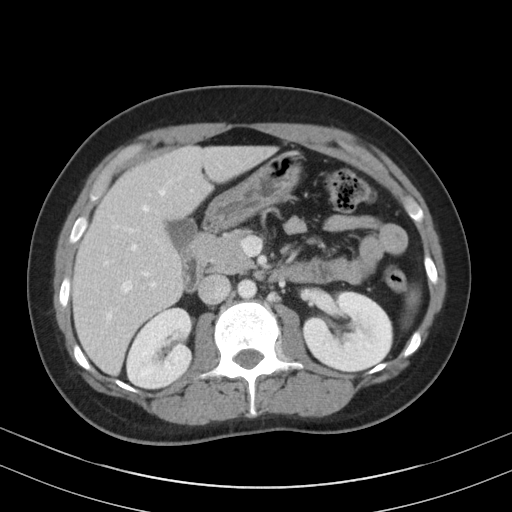
[im 63/90  bone]
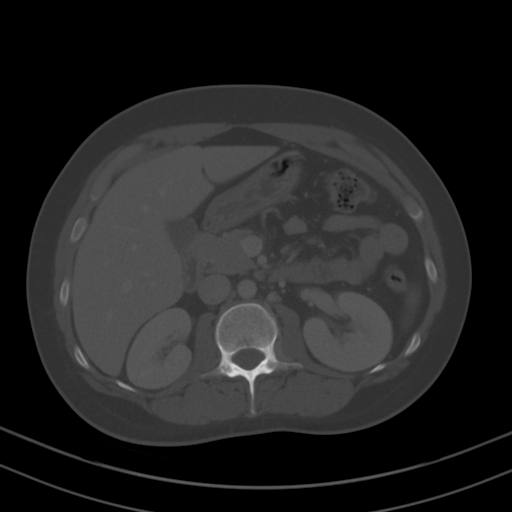
[im 72/90  soft-tissue]
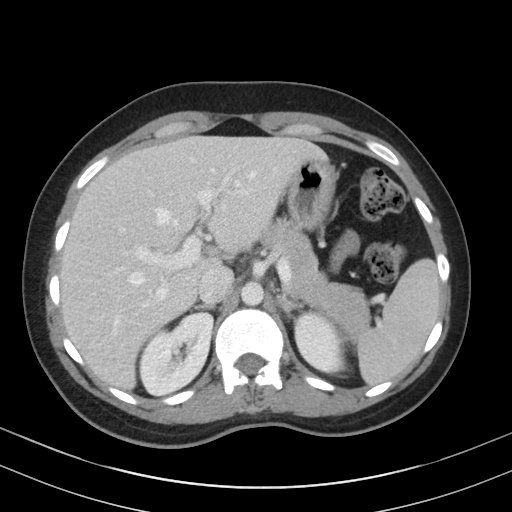
[im 72/90  lung]
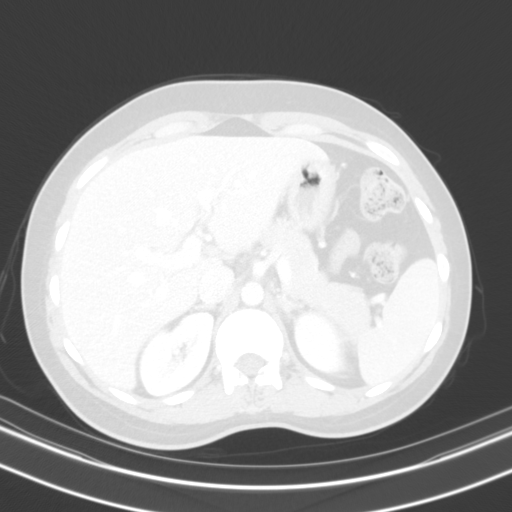
[im 76/90  soft-tissue]
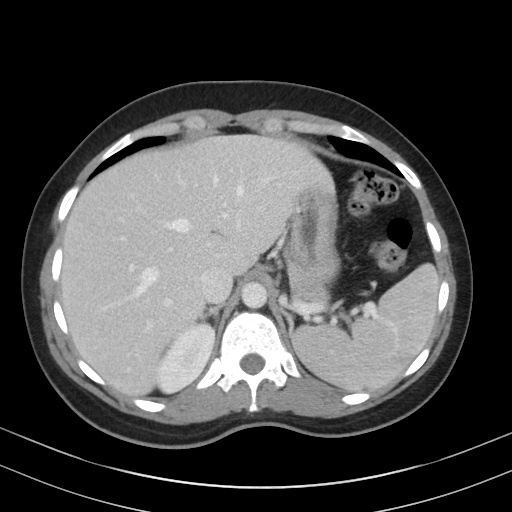
[im 76/90  lung]
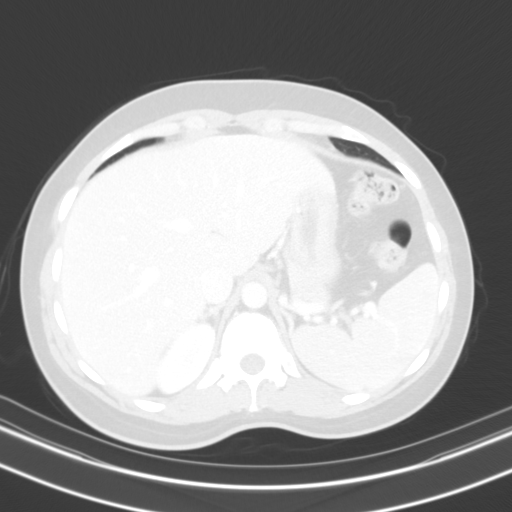
[im 81/90  lung]
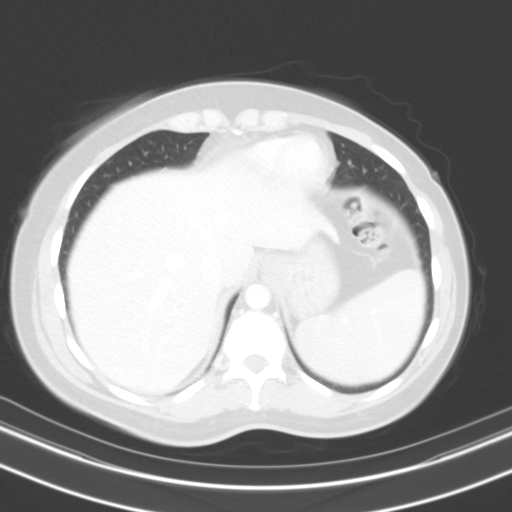
[im 85/90  soft-tissue]
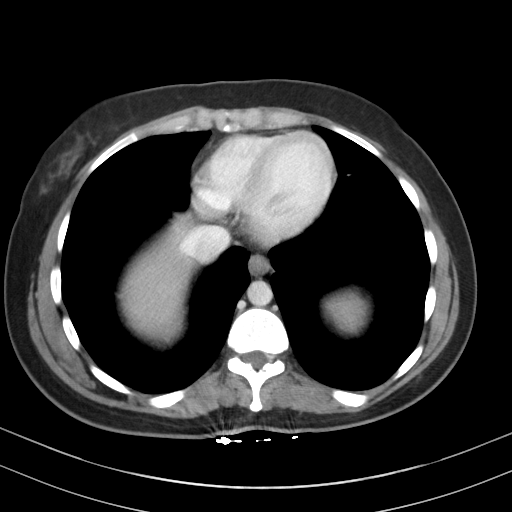
[im 85/90  lung]
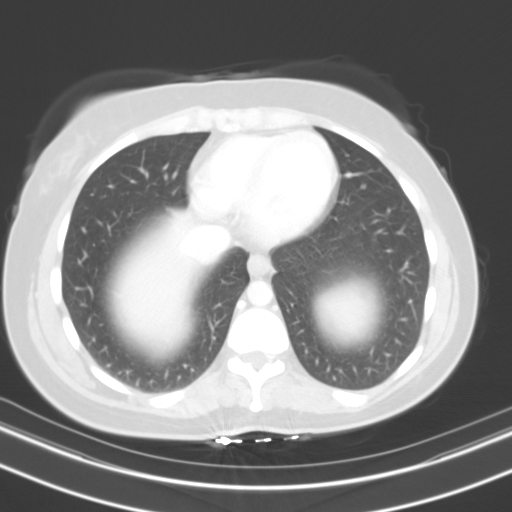

[13 of 32 positions shown; findings below may reference images not displayed]

FINDINGS: Lower chest: Lung bases clear

Hepatobiliary: Gallbladder and liver normal appearance

Pancreas: Normal appearance

Spleen: Normal appearance

Adrenals/Urinary Tract: Adrenal glands, kidneys, ureters, and
bladder normal appearance

Stomach/Bowel: Appendix surgically absent by history. Stomach and
large bowel unremarkable. Extensive bowel wall thickening of
terminal ileum extending into ileocecal valve with extensive
surrounding inflammatory changes. Associated free fluid and
mesenteric stranding. Findings are consistent with enteritis with
question perforation. Ring-enhancing collection in RIGHT pelvis [DATE]
x 3.1 cm image 60 a could represent an ovarian/adnexal cystic lesion
or an abscess. Additional abscess collection within the small bowel
mesentery in the RIGHT pelvis [DATE] x 1.8 cm image 57, extending
cm length. Associated hyperemia of mesentery to terminal ileum.

Vascular/Lymphatic: Vascular structures patent. Multiple normal
sized mesenteric lymph nodes RIGHT lower quadrant.

Reproductive: Uterus and adnexa otherwise unremarkable

Other: Small amount of free pelvic fluid.  No free air.  No hernia.

Musculoskeletal: Unremarkable
IMPRESSION: Extensive bowel wall thickening of the terminal ileum extending into
ileocecal valve with extensive surrounding inflammatory changes
consistent with enteritis, question Crohn's disease, less likely
infectious enteritis or foreign body perforation.

Associated free fluid and mesenteric stranding with a small abscess
collection within the mesentery 2.2 cm greatest size and a second
focal fluid collection in the RIGHT adnexa question second abscess
3.2 cm diameter versus ovarian cyst.

No free air identified.

These results will be called to the ordering clinician or
representative by the Radiologist Assistant, and communication
documented in the PACS or [REDACTED].

## 2024-09-23 ENCOUNTER — Ambulatory Visit: Admission: EM | Admit: 2024-09-23 | Discharge: 2024-09-23 | Disposition: A | Discharge status: Home / Self Care

## 2024-09-23 DIAGNOSIS — U071 COVID-19: Secondary | ICD-10-CM | POA: Diagnosis not present

## 2024-09-23 DIAGNOSIS — R030 Elevated blood-pressure reading, without diagnosis of hypertension: Secondary | ICD-10-CM

## 2024-09-23 NOTE — ED Triage Notes (Signed)
 Patient to Urgent Care with complaints of positive Covid home test- having sore throat/ nasal congestion/ right sided ear pain/ headaches/ cough/ sneezing.   Symptoms started Saturday. Fever yesterday 100.5   Taking sudafed/ bromfed cough syrup/ nebulizer and humidifier.

## 2024-09-23 NOTE — Telephone Encounter (Signed)
 Patient called to report tested positive for covid and has an infusion appointment for 09/27/24. Per provider, patient to be rescheduled for two weeks. Patient verbalized understanding.

## 2024-09-23 NOTE — ED Provider Notes (Signed)
 " [facility]    CSN: [id number] Arrival date & time: 09/23/24  1002      History   Chief Complaint Chief Complaint  Patient presents with   Covid Positive    HPI Carla Stephens is a 34 y.o. female.  Patient presents with 2-day history of fever, congestion, ear pain, sore throat, runny nose, sneezing, headache, cough.  Tmax 100.5.  She has been taking OTC Sudafed; last dose 0800 and prior to that 0300.  She took Bromfed-DM last night.  She denies chest pain, shortness of breath, weakness.  [facility]; diagnosed with common cold; negative flu and COVID at that time; treated with Bromfed-DM.  Patient's medical history includes Crohn's disease.  The history is provided by the patient and medical records.    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      Home Medications    Prior to Admission medications  Medication Sig Start Date End Date Taking? Authorizing Provider  brompheniramine-pseudoephedrine-DM 30-2-10 MG/5ML syrup Take 10 mLs by mouth 4 (four) times daily as needed. 09/09/24  Yes [provider]  inFLIXimab-dyyb (INFLECTRA) 100 MG SOLR Inject 5 mg/kg into the vein. 05/04/22  Yes [provider]  AVIANE 0.1-20 MG-MCG tablet Take 1 tablet by mouth daily.    [provider]  azaTHIOprine (IMURAN) 50 MG tablet Take 50 mg by mouth daily. Patient not taking: Reported on 09/23/2024 02/05/24   [provider]    Family History Family History  Problem Relation Age of Onset   Hypertension Mother    Hypertension Maternal  Grandmother    Cancer Neg Hx    Diabetes Neg Hx    Hyperlipidemia Neg Hx    Colon cancer Neg Hx    Esophageal cancer Neg Hx    Pancreatic cancer Neg Hx    Liver disease Neg Hx     Social History Social History[1]   Allergies   Patient has no known allergies.   Review of Systems Review of Systems  Constitutional:  Positive for fever. Negative for chills.  HENT:  Positive for congestion, ear pain, postnasal drip, rhinorrhea, sneezing and sore throat.   Respiratory:  Positive for cough. Negative for shortness of breath.   Cardiovascular:  Negative for chest pain and palpitations.  Neurological:  Negative for dizziness, weakness and numbness.     Physical Exam Triage Vital Signs ED Triage Vitals [09/23/24 1039]  Encounter Vitals Group     BP (!) 156/103     Girls Systolic BP Percentile      Girls Diastolic BP Percentile      Boys Systolic BP Percentile      Boys Diastolic BP Percentile      Pulse Rate (!) 120     Resp 18     Temp 98 F (36.7 C)     Temp src      SpO2 97 %     Weight      Height      Head Circumference  Peak Flow      Pain Score      Pain Loc      Pain Education      Exclude from Growth Chart    No data found.  Updated Vital Signs BP (!) 162/99   Pulse (!) 120   Temp 98 F (36.7 C)   Resp 18   LMP 09/19/2024   SpO2 98%   Visual Acuity Right Eye Distance:   Left Eye Distance:   Bilateral Distance:    Right Eye Near:   Left Eye Near:    Bilateral Near:     Physical Exam Constitutional:      General: She is not in acute distress. HENT:     Right Ear: Tympanic membrane normal.     Left Ear: Tympanic membrane normal.     Nose: Congestion and rhinorrhea present.     Mouth/Throat:     Mouth: Mucous membranes are moist.     Pharynx: Oropharynx is clear.  Cardiovascular:     Rate and Rhythm: Regular rhythm. Tachycardia present.     Heart sounds: Normal heart sounds.  Pulmonary:     Effort: Pulmonary effort is normal. No  respiratory distress.     Breath sounds: Normal breath sounds.  Neurological:     General: No focal deficit present.     Mental Status: She is alert.     Sensory: No sensory deficit.     Motor: No weakness.     Gait: Gait normal.      UC Treatments / Results  Labs (all labs ordered are listed, but only abnormal results are displayed) Labs Reviewed - No data to display  EKG   Radiology No results found.  Procedures Procedures (including critical care time)  Medications Ordered in UC Medications - No data to display  Initial Impression / Assessment and Plan / UC Course  I have reviewed the triage vital signs and the nursing notes.  Pertinent labs & imaging results that were available during my care of the patient were reviewed by me and considered in my medical decision making (see chart for details).    COVID-19, elevated blood pressure reading.  Patient declines transfer to the ED.  She does not have history of hypertension.  She states her blood pressure is usually elevated when she is at the doctor's office.  She takes her blood pressures at home and states she is usually 120/70.  She took OTC Sudafed this morning and Bromfed-DM last night.  She tested positive for COVID at home 2 days ago.  Based on her blood pressure readings today, not prescribing Paxlovid because it has side effect of elevated blood pressure.  Discussed symptomatic care, including ibuprofen and plain Mucinex.  Instructed patient to stop taking Sudafed and Bromfed-DM; and cautioned her to avoid OTC medications that can elevate blood pressure.  Education provided on COVID instructed her to monitor her blood pressure readings at home and guidelines for monitoring blood pressure at home given, including ED precautions.  Instructed her to establish a PCP as soon as possible.  Strict ED precautions given.  She agrees to plan of care. Final Clinical Impressions(s) / UC Diagnoses   Final diagnoses:  COVID-19   Elevated blood pressure reading     Discharge Instructions      Stop taking Sudafed and Bromfed DM.  Take ibuprofen and plain Mucinex as directed.  See the attached information on COVID.  Your blood pressure is high today.  Monitor your  blood pressure at home.  Go to the emergency department if needed as outlined in the attached blood pressure guidelines.  Blood pressure readings were:156/103; repeated 170/122; repeat again 162/99.     ED Prescriptions   None    PDMP not reviewed this encounter.    [1]  Social History Tobacco Use   Smoking status: Never   Smokeless tobacco: Never  Vaping Use   Vaping status: Never Used  Substance Use Topics   Alcohol use: No    Alcohol/week: 0.0 standard drinks of alcohol   Drug use: No     Corlis Burnard DEL, NP 09/23/24 1124  "

## 2024-09-23 NOTE — Discharge Instructions (Addendum)
 Stop taking Sudafed and Bromfed DM.  Take ibuprofen and plain Mucinex as directed.  See the attached information on COVID.  Your blood pressure is high today.  Monitor your blood pressure at home.  Go to the emergency department if needed as outlined in the attached blood pressure guidelines.  Blood pressure readings were:156/103; repeated 170/122; repeat again 162/99.

## 2024-09-29 ENCOUNTER — Encounter: Payer: Self-pay | Admitting: Emergency Medicine

## 2024-09-29 ENCOUNTER — Ambulatory Visit
Admission: EM | Admit: 2024-09-29 | Discharge: 2024-09-29 | Disposition: A | Discharge status: Home / Self Care | Attending: Emergency Medicine | Admitting: Emergency Medicine

## 2024-09-29 DIAGNOSIS — R030 Elevated blood-pressure reading, without diagnosis of hypertension: Secondary | ICD-10-CM

## 2024-09-29 DIAGNOSIS — U071 COVID-19: Secondary | ICD-10-CM

## 2024-09-29 DIAGNOSIS — R051 Acute cough: Secondary | ICD-10-CM

## 2024-09-29 MED ORDER — PROMETHAZINE-DM 6.25-15 MG/5ML PO SYRP: 5.0000 mL | ORAL_SOLUTION | Freq: Four times a day (QID) | ORAL | 0 refills | Status: AC | PRN

## 2024-09-29 MED ORDER — BENZONATATE 100 MG PO CAPS: 100.0000 mg | ORAL_CAPSULE | Freq: Three times a day (TID) | ORAL | 0 refills | Status: AC | PRN

## 2024-09-29 NOTE — ED Triage Notes (Addendum)
 Patient in office today complaint of congestion, cough and Headache x1wk. Was dx 1wk ago for covid home test positive.  Patient was wondering if she might have bronchitis since this cough and congestion has been lingering  NUR:flrpwzk, ibuprofen, tylenol   Denies:N/V and fever

## 2024-09-29 NOTE — Discharge Instructions (Addendum)
 Take the Tessalon  Perles as directed for cough.    Take the Promethazine  DM as directed for cough.  Do not drive, operate machinery, drink alcohol, or perform dangerous activities while taking this medication as it may cause drowsiness.  Follow up with your primary care provider tomorrow.  Go to the emergency department if you have worsening symptoms.    Your blood pressure is elevated today at 170/100; repeat 141/103.  Please have this rechecked by your primary care provider this week.

## 2024-09-29 NOTE — ED Provider Notes (Signed)
 " CAY RALPH PELT    CSN: [id number] Arrival date & time: 09/29/24  9041      History   Chief Complaint Chief Complaint  Patient presents with   Cough   Headache   Nasal Congestion    HPI Carla Stephens is a 34 y.o. female.  Patient presents with 1 week history of congestion and cough.  No fever, chest pain, shortness of breath.  Her congestion is improving and she has not needed to take Mucinex in the last 2 days.  Her cough is lingering but is also improving.  It is worse at night and when she lies down.  No OTC medications taken today.  Patient was seen here on 09/23/2024; diagnosed with COVID-19 and elevated blood pressure reading; she reported positive COVID test at home at that time; Paxlovid was not prescribed due to the patient's elevated blood pressure readings; symptomatic treatment and instructed patient to follow-up with her PCP.  The history is provided by the patient and medical records.    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      Home Medications    Prior to Admission medications  Medication Sig Start Date End Date Taking? Authorizing Provider  benzonatate  (TESSALON ) 100 MG capsule Take 1 capsule (100 mg total) by mouth 3 (three) times daily as needed for cough. 09/29/24  Yes Corlis Burnard DEL, NP  promethazine -dextromethorphan (PROMETHAZINE -DM) 6.25-15 MG/5ML syrup Take 5 mLs by mouth 4 (four) times daily as needed. 09/29/24  Yes Corlis Burnard DEL, NP  AVIANE 0.1-20 MG-MCG tablet Take 1 tablet by mouth daily.    [provider]  azaTHIOprine (IMURAN) 50 MG tablet Take 50 mg  by mouth daily. Patient not taking: Reported on 09/23/2024 02/05/24   [provider]  inFLIXimab-dyyb (INFLECTRA) 100 MG SOLR Inject 5 mg/kg into the vein. 05/04/22   [provider]    Family History Family History  Problem Relation Age of Onset   Hypertension Mother    Hypertension Maternal Grandmother    Cancer Neg Hx    Diabetes Neg Hx    Hyperlipidemia Neg Hx    Colon cancer Neg Hx    Esophageal cancer Neg Hx    Pancreatic cancer Neg Hx    Liver disease Neg Hx     Social History Social History[1]   Allergies   Patient has no known allergies.   Review of Systems Review of Systems  Constitutional:  Negative for chills and fever.  HENT:  Positive for congestion. Negative for ear pain and sore throat.   Respiratory:  Positive for cough. Negative for shortness of breath.   Cardiovascular:  Negative for chest pain and palpitations.     Physical Exam Triage Vital Signs ED Triage Vitals  Encounter Vitals Group     BP 09/29/24 1054 (!) 170/100     Girls Systolic BP Percentile --      Girls Diastolic BP Percentile --      Boys Systolic BP Percentile --      Boys Diastolic BP  Percentile --      Pulse Rate 09/29/24 1054 100     Resp 09/29/24 1054 18     Temp 09/29/24 1054 97.9 F (36.6 C)     Temp src --      SpO2 09/29/24 1054 98 %     Weight 09/29/24 1052 154 lb (69.9 kg)     Height 09/29/24 1052 5' 4 (1.626 m)     Head Circumference --      Peak Flow --      Pain Score 09/29/24 1052 0     Pain Loc --      Pain Education --      Exclude from Growth Chart --    No data found.  Updated Vital Signs BP (!) 141/103   Pulse 100   Temp 97.9 F (36.6 C)   Resp 18   Ht 5' 4 (1.626 m)   Wt 154 lb (69.9 kg)   LMP 09/19/2024   SpO2 98%   BMI 26.43 kg/m   Visual Acuity Right Eye Distance:   Left Eye Distance:   Bilateral Distance:    Right Eye Near:   Left Eye Near:    Bilateral Near:     Physical Exam Constitutional:      General:  She is not in acute distress. HENT:     Right Ear: Tympanic membrane normal.     Left Ear: Tympanic membrane normal.     Nose: Rhinorrhea present.     Mouth/Throat:     Mouth: Mucous membranes are moist.     Pharynx: Oropharynx is clear.  Cardiovascular:     Rate and Rhythm: Normal rate and regular rhythm.     Heart sounds: Normal heart sounds.  Pulmonary:     Effort: Pulmonary effort is normal. No respiratory distress.     Breath sounds: Normal breath sounds.  Neurological:     Mental Status: She is alert.      UC Treatments / Results  Labs (all labs ordered are listed, but only abnormal results are displayed) Labs Reviewed - No data to display  EKG   Radiology No results found.  Procedures Procedures (including critical care time)  Medications Ordered in UC Medications - No data to display  Initial Impression / Assessment and Plan / UC Course  I have reviewed the triage vital signs and the nursing notes.  Pertinent labs & imaging results that were available during my care of the patient were reviewed by me and considered in my medical decision making (see chart for details).   Cough, COVID, elevated blood pressure reading.  Lungs are clear and O2 sat is 98% on room air.  Patient states her symptoms are improving but she has lingering cough.  She has not taken OTC medications in the last couple of days.  Treating cough today with Tessalon  Perles and Promethazine  DM.  Precautions for drowsiness with promethazine  discussed.  Education provided on cough and COVID.  Patient's blood pressure remains elevated today initially 170/100 but improved to 141/103.  Instructed her to have this rechecked by her PCP.  Education provided on monitoring blood pressure.  Instructed her to follow-up with her PCP tomorrow.  ED precautions given.  She agrees to plan of care.   Final Clinical Impressions(s) / UC Diagnoses   Final diagnoses:  Acute cough  COVID-19  Elevated blood pressure  reading     Discharge Instructions      Take the Tessalon  Perles as directed for cough.  Take the Promethazine  DM as directed for cough.  Do not drive, operate machinery, drink alcohol, or perform dangerous activities while taking this medication as it may cause drowsiness.  Follow up with your primary care provider tomorrow.  Go to the emergency department if you have worsening symptoms.    Your blood pressure is elevated today at 170/100; repeat 141/103.  Please have this rechecked by your primary care provider this week.          ED Prescriptions     Medication Sig Dispense Auth. Provider   promethazine -dextromethorphan (PROMETHAZINE -DM) 6.25-15 MG/5ML syrup Take 5 mLs by mouth 4 (four) times daily as needed. 118 mL Corlis Burnard DEL, NP   benzonatate  (TESSALON ) 100 MG capsule Take 1 capsule (100 mg total) by mouth 3 (three) times daily as needed for cough. 21 capsule Corlis Burnard DEL, NP      PDMP not reviewed this encounter.    [1]  Social History Tobacco Use   Smoking status: Never   Smokeless tobacco: Never  Vaping Use   Vaping status: Never Used  Substance Use Topics   Alcohol use: No    Alcohol/week: 0.0 standard drinks of alcohol   Drug use: No     Corlis Burnard DEL, NP 09/29/24 1143  "
